# Patient Record
Sex: Male | Born: 1950 | Race: Black or African American | Hispanic: No | Marital: Married | Smoking: Never smoker
Health system: Southern US, Community
[De-identification: ages and names within clinical notes are randomized; demographics above are authoritative.]

---

## 2008-01-18 ENCOUNTER — Ambulatory Visit: Payer: Self-pay | Admitting: Otolaryngology

## 2008-03-08 ENCOUNTER — Ambulatory Visit: Payer: Self-pay | Admitting: Otolaryngology

## 2010-03-28 ENCOUNTER — Ambulatory Visit: Payer: Self-pay | Admitting: Unknown Physician Specialty

## 2017-11-01 ENCOUNTER — Other Ambulatory Visit: Payer: Self-pay

## 2017-11-01 ENCOUNTER — Encounter: Payer: Self-pay | Admitting: Gynecology

## 2017-11-01 ENCOUNTER — Ambulatory Visit
Admission: EM | Admit: 2017-11-01 | Discharge: 2017-11-01 | Disposition: A | Discharge status: Home / Self Care | Payer: Medicare Other | Attending: Family Medicine | Admitting: Family Medicine

## 2017-11-01 DIAGNOSIS — L539 Erythematous condition, unspecified: Secondary | ICD-10-CM

## 2017-11-01 DIAGNOSIS — S20162A Insect bite (nonvenomous) of breast, left breast, initial encounter: Secondary | ICD-10-CM

## 2017-11-01 MED ORDER — METHYLPREDNISOLONE SODIUM SUCC 40 MG IJ SOLR
80.0000 mg | Freq: Once | INTRAMUSCULAR | Status: AC
  Administered 2017-11-01: 80 mg via INTRAMUSCULAR

## 2017-11-01 MED ORDER — DOXYCYCLINE HYCLATE 100 MG PO CAPS: 100.0000 mg | ORAL_CAPSULE | Freq: Two times a day (BID) | ORAL | 0 refills | Status: DC

## 2017-11-01 NOTE — ED Provider Notes (Signed)
[facility]    CSN: [id number] Arrival date & time: 11/01/17  1102  History   Chief Complaint Chief Complaint  Patient presents with  . Insect Bite   HPI  67 year old male presents with possible insect bite of his left breast.  Patient states that he was outside weed eating yesterday.  He states that he suddenly felt an itchy area around his left breast.  He did not see an insect or feel a sting.  No sniffing pain.  He subsequently developed swelling and redness of the left breast.  He reports continued associated itching.  Also warmth. No reports of fever or chills.  No medications or interventions tried.  No known exacerbating or relieving factors.  No other associated symptoms.  No other complaints.  PMH: Obesity, unspecified    HTN (hypertension)    Impaired glucose tolerance     Surgical Hx: Appendectomy   Home Medications    Prior to Admission medications   Medication Sig Start Date End Date Taking? Authorizing Provider  Multiple Vitamin (MULTIVITAMIN) capsule Take by mouth.   Yes [provider]  sildenafil (REVATIO) 20 MG tablet May use 1-5 tabs daily 1/2 hr before intercourse 09/16/16  Yes [provider]  atorvastatin (LIPITOR) 20 MG tablet Take by mouth. 01/03/17 01/03/18  [provider]  doxycycline (VIBRAMYCIN) 100 MG capsule Take 1 capsule (100 mg total) by mouth 2 (two) times daily. 11/01/17   Tommie Samsook, Hadlei Stitt G, DO  fluticasone (FLONASE) 50 MCG/ACT nasal spray Place into the nose. 01/02/17 01/02/18  [provider]    Family History Throat cancer Brother    No Known Problems Father    No Known Problems Maternal Grandfather    No Known Problems Maternal Grandmother    High blood pressure (Hypertension) Mother    Kidney cancer Mother    No Known Problems Paternal Grandfather    No Known Problems Paternal Grandmother     Social History Social History   Tobacco Use  . Smoking status: Never Smoker    . Smokeless tobacco: Never Used  Substance Use Topics  . Alcohol use: Never    Frequency: Never  . Drug use: Never   Allergies   Patient has no allergy information on record.   Review of Systems Review of Systems  Constitutional: Negative.   Skin:       Left breast swelling, erythema. Warmth. Itching.    Physical Exam Triage Vital Signs ED Triage Vitals  Enc Vitals Group     BP 11/01/17 1119 111/70     Pulse Rate 11/01/17 1119 60     Resp 11/01/17 1119 18     Temp 11/01/17 1119 97.8 F (36.6 C)     Temp Source 11/01/17 1119 Oral     SpO2 11/01/17 1119 98 %     Weight 11/01/17 1119 (!) 305 lb (138.3 kg)     Height 11/01/17 1119 5\' 10"  (1.778 m)     Head Circumference --      Peak Flow --      Pain Score 11/01/17 1118 0     Pain Loc --      Pain Edu? --      Excl. in GC? --    Updated Vital Signs BP 111/70 (BP Location: Left Arm)   Pulse 60   Temp 97.8 F (36.6 C) (Oral)   Resp 18   Ht 5\' 10"  (1.778 m)   Wt (!) 305 lb (138.3 kg)   SpO2  98%   BMI 43.76 kg/m   Physical Exam  Constitutional: He is oriented to person, place, and time. He appears well-developed. No distress.  HENT:  Head: Normocephalic and atraumatic.  Pulmonary/Chest: Effort normal. No respiratory distress.  Neurological: He is alert and oriented to person, place, and time.  Skin:  Left breast -diffuse erythema which is quite extensive and spreads outward to the upper abdomen as well as laterally.  Mild warmth.  No appreciable mass or fluctuance.  No tenderness.  Psychiatric: He has a normal mood and affect. His behavior is normal.  Nursing note and vitals reviewed.  UC Treatments / Results  Labs (all labs ordered are listed, but only abnormal results are displayed) Labs Reviewed - No data to display  EKG None  Radiology No results found.  Procedures Procedures (including critical care time)  Medications Ordered in UC Medications  methylPREDNISolone sodium succinate (SOLU-MEDROL)  40 mg/mL injection 80 mg (80 mg Intramuscular Given 11/01/17 1141)    Initial Impression / Assessment and Plan / UC Course  I have reviewed the triage vital signs and the nursing notes.  Pertinent labs & imaging results that were available during my care of the patient were reviewed by me and considered in my medical decision making (see chart for details).    67 year old male presents with left breast erythema, warmth, and itching.  Bug bite or bee sting and allergic response versus cellulitis.  Placing on doxycycline to be conservative.  Patient was given a shot of Depo-Medrol and also instructed to take Benadryl.  Clinically I suspect that this is allergic in nature given the itching.  However, given the extensive erythema, I elected to put him on doxycycline to ensure no underlying cellulitis.  I advised him to follow-up here or go to the hospital if he fails to improve or worsens.  Final Clinical Impressions(s) / UC Diagnoses   Final diagnoses:  Breast erythema     Discharge Instructions     Antibiotic as prescribed.  Benadryl for itching and redness.  Call with concerns or if not improving.  Take care  Dr. Adriana Simas    ED Prescriptions    Medication Sig Dispense Auth. Provider   doxycycline (VIBRAMYCIN) 100 MG capsule Take 1 capsule (100 mg total) by mouth 2 (two) times daily. 14 capsule Tommie Sams, DO     Controlled Substance Prescriptions  Controlled Substance Registry consulted? Not Applicable   Tommie Sams, DO 11/01/17 1156

## 2017-11-01 NOTE — Discharge Instructions (Signed)
Antibiotic as prescribed.  Benadryl for itching and redness.  Call with concerns or if not improving.  Take care  Dr. Adriana Simasook

## 2017-11-01 NOTE — ED Triage Notes (Signed)
Per patient insect bite at his left breast x yesterday while mowing his yard. Per patient warn to the touch.

## 2018-09-29 ENCOUNTER — Emergency Department: Payer: Medicare Other

## 2018-09-29 ENCOUNTER — Encounter: Payer: Self-pay | Admitting: Emergency Medicine

## 2018-09-29 ENCOUNTER — Other Ambulatory Visit: Payer: Self-pay

## 2018-09-29 ENCOUNTER — Emergency Department
Admission: EM | Admit: 2018-09-29 | Discharge: 2018-09-29 | Disposition: A | Discharge status: Home / Self Care | Payer: Medicare Other | Attending: Emergency Medicine | Admitting: Emergency Medicine

## 2018-09-29 DIAGNOSIS — R2681 Unsteadiness on feet: Secondary | ICD-10-CM | POA: Insufficient documentation

## 2018-09-29 DIAGNOSIS — M4802 Spinal stenosis, cervical region: Secondary | ICD-10-CM | POA: Diagnosis not present

## 2018-09-29 DIAGNOSIS — R531 Weakness: Secondary | ICD-10-CM | POA: Insufficient documentation

## 2018-09-29 DIAGNOSIS — G9520 Unspecified cord compression: Secondary | ICD-10-CM | POA: Insufficient documentation

## 2018-09-29 DIAGNOSIS — R202 Paresthesia of skin: Secondary | ICD-10-CM | POA: Diagnosis present

## 2018-09-29 DIAGNOSIS — Z7982 Long term (current) use of aspirin: Secondary | ICD-10-CM | POA: Insufficient documentation

## 2018-09-29 DIAGNOSIS — M79604 Pain in right leg: Secondary | ICD-10-CM | POA: Diagnosis not present

## 2018-09-29 LAB — COMPREHENSIVE METABOLIC PANEL
ALT: 16 U/L (ref 0–44)
AST: 22 U/L (ref 15–41)
Albumin: 4.2 g/dL (ref 3.5–5.0)
Alkaline Phosphatase: 84 U/L (ref 38–126)
Anion gap: 7 (ref 5–15)
BUN: 13 mg/dL (ref 8–23)
CO2: 23 mmol/L (ref 22–32)
Calcium: 8.4 mg/dL — ABNORMAL LOW (ref 8.9–10.3)
Chloride: 109 mmol/L (ref 98–111)
Creatinine, Ser: 0.93 mg/dL (ref 0.61–1.24)
GFR calc Af Amer: >60 mL/min (ref >60)
GFR calc non Af Amer: >60 mL/min (ref >60)
Glucose, Bld: 127 mg/dL — ABNORMAL HIGH (ref 70–99)
Potassium: 3.5 mmol/L (ref 3.5–5.1)
Sodium: 139 mmol/L (ref 135–145)
Total Bilirubin: 0.7 mg/dL (ref 0.3–1.2)
Total Protein: 7 g/dL (ref 6.5–8.1)

## 2018-09-29 LAB — CBC
HCT: 39.8 % (ref 39.0–52.0)
Hemoglobin: 13.7 g/dL (ref 13.0–17.0)
MCH: 30.2 pg (ref 26.0–34.0)
MCHC: 34.4 g/dL (ref 30.0–36.0)
MCV: 87.9 fL (ref 80.0–100.0)
Platelets: 229 10*3/uL (ref 150–400)
RBC: 4.53 MIL/uL (ref 4.22–5.81)
RDW: 13.7 % (ref 11.5–15.5)
WBC: 7.6 10*3/uL (ref 4.0–10.5)
nRBC: 0 % (ref 0.0–0.2)

## 2018-09-29 NOTE — ED Notes (Signed)
Waiting for personnel from [facility] to apply aspen cervical collar.

## 2018-09-29 NOTE — ED Notes (Signed)
BIO- TECH  CALLED  FOR  BRACE  PER  DR  PADUCHOWSKI  MD

## 2018-09-29 NOTE — ED Provider Notes (Signed)
[facility] Emergency Department Provider Note  ____________________________________________  Time seen: Approximately 1:24 PM  I have reviewed the triage vital signs and the nursing notes.   HISTORY  Chief Complaint Numbness    HPI Lonnie Rodriguez is a 68 y.o. male presents to the emergency department with right lower extremity tingling, weakness and pain that started 3 weeks ago after patient had a severe episode of low back pain.  Patient also states that he has developed tingling of the bilateral hands over the past 3 weeks as well.  He states that he did not seek care for his complaints due to COVID-19.  Patient states that pain right lower extremity has been severe enough that he is dragging his leg.  He denies fever or bowel or bladder incontinence.  He denies a history of similar issues in the past.        History reviewed. No pertinent past medical history.  There are no active problems to display for this patient.   History reviewed. No pertinent surgical history.  Prior to Admission medications   Medication Sig Start Date End Date Taking? Authorizing Provider  aspirin EC 81 MG tablet Take 81 mg by mouth daily.   Yes [provider]  naproxen sodium (ALEVE) 220 MG tablet Take 220-440 mg by mouth 2 (two) times daily as needed (back pain and/or stiffness).   Yes [provider]    Allergies Patient has no known allergies.  No family history on file.  Social History Social History   Tobacco Use  . Smoking status: Never Smoker  . Smokeless tobacco: Never Used  Substance Use Topics  . Alcohol use: Never    Frequency: Never  . Drug use: Never     Review of Systems  Constitutional: No fever/chills Eyes: No visual changes. No discharge ENT: No upper respiratory complaints. Cardiovascular: no chest pain. Respiratory: no cough. No SOB. Gastrointestinal: No abdominal pain.  No nausea, no vomiting.  No diarrhea.  No  constipation. Musculoskeletal: Patient has right lower extremity pain and weakness.  Skin: Negative for rash, abrasions, lacerations, ecchymosis. Neurological: Patient has paresthesias of the bilateral hands and weakness in the right lower extremity.  ____________________________________________   PHYSICAL EXAM:  VITAL SIGNS: ED Triage Vitals  Enc Vitals Group     BP 09/29/18 1205 122/70     Pulse Rate 09/29/18 1101 84     Resp 09/29/18 1101 20     Temp 09/29/18 1101 98.5 F (36.9 C)     Temp Source 09/29/18 1101 Oral     SpO2 09/29/18 1101 98 %     Weight 09/29/18 1102 (!) 305 lb (138.3 kg)     Height 09/29/18 1102 5\' 10"  (1.778 m)     Head Circumference --      Peak Flow --      Pain Score 09/29/18 1102 0     Pain Loc --      Pain Edu? --      Excl. in GC? --      Constitutional: Alert and oriented. Well appearing and in no acute distress. Eyes: Conjunctivae are normal. PERRL. EOMI. Head: Atraumatic. ENT:      Ears: TMs are pearly.       Nose: No congestion/rhinnorhea.      Mouth/Throat: Mucous membranes are moist.  Neck: No stridor.  No cervical spine tenderness to palpation. Cardiovascular: Normal rate, regular rhythm. Normal S1 and S2.  Good peripheral circulation. Respiratory: Normal respiratory effort  without tachypnea or retractions. Lungs CTAB. Good air entry to the bases with no decreased or absent breath sounds. Gastrointestinal: Bowel sounds 4 quadrants. Soft and nontender to palpation. No guarding or rigidity. No palpable masses. No distention. No CVA tenderness. Musculoskeletal: Patient has 5 out of 5 strength in the upper and lower extremities bilaterally and symmetrically.  Full range of motion to all extremities. No gross deformities appreciated. Neurologic:  Normal speech and language.  Patient complains of paresthesias diffusely along right lower extremity in no specific dermatomal pattern. He has intact sensation to light and deep touch. Skin:  Skin is  warm, dry and intact. No rash noted. Psychiatric: Mood and affect are normal. Speech and behavior are normal. Patient exhibits appropriate insight and judgement.   ____________________________________________   LABS (all labs ordered are listed, but only abnormal results are displayed)  Labs Reviewed  COMPREHENSIVE METABOLIC PANEL - Abnormal; Notable for the following components:      Result Value   Glucose, Bld 127 (*)    Calcium 8.4 (*)    All other components within normal limits  CBC  CBG MONITORING, ED   ____________________________________________  EKG   ____________________________________________  RADIOLOGY I personally viewed and evaluated these images as part of my medical decision making, as well as reviewing the written report by the radiologist.  Dg Cervical Spine With Flex & Extend  Result Date: 09/29/2018 CLINICAL DATA:  Pt reports numbness and losing balance on the right side for 3 weeks. Pt reports swelling in both feet as well. Pt states his MD sent him here, no other pertinent medical history EXAM: CERVICAL SPINE COMPLETE WITH FLEXION AND EXTENSION VIEWS COMPARISON:  MR 09/29/2018 FINDINGS: Straightening of the normal cervical lordosis. No evidence of dynamic instability on flexion/extension. Negative for fracture. Mild narrowing of interspaces C3-4, C4-5, C6-7. Fusion across the C5-6 interspace. No prevertebral soft tissue swelling. Multiple missing teeth and dental restorations. IMPRESSION: 1. Negative for fracture or other acute bone abnormality. 2. Multilevel spondylitic changes as above. 3. Loss of the normal cervical spine lordosis, which may be secondary to positioning, spasm, or soft tissue injury. Electronically Signed   By: Corlis Leak M.D.   On: 09/29/2018 16:53   Ct Head Wo Contrast  Result Date: 09/29/2018 CLINICAL DATA:  Numbness and balance disturbance over the last 3 weeks. Swelling of the lower extremities. EXAM: CT HEAD WITHOUT CONTRAST TECHNIQUE:  Contiguous axial images were obtained from the base of the skull through the vertex without intravenous contrast. COMPARISON:  None. FINDINGS: Brain: The brain shows a normal appearance without evidence of malformation, atrophy, old or acute small or large vessel infarction, mass lesion, hemorrhage, hydrocephalus or extra-axial collection. Vascular: No hyperdense vessel. No evidence of atherosclerotic calcification. Skull: Normal.  No traumatic finding.  No focal bone lesion. Sinuses/Orbits: Sinuses are clear. Orbits appear normal. Mastoids are clear. Other: None significant IMPRESSION: Normal head CT Electronically Signed   By: Paulina Fusi M.D.   On: 09/29/2018 11:27   Mr Brain Wo Contrast  Result Date: 09/29/2018 CLINICAL DATA:  Numbness and balance disturbance. EXAM: MRI HEAD WITHOUT CONTRAST TECHNIQUE: Multiplanar, multiecho pulse sequences of the brain and surrounding structures were obtained without intravenous contrast. COMPARISON:  Head CT 09/29/2018 FINDINGS: BRAIN: There is no acute infarct, acute hemorrhage or extra-axial collection. The midline structures are normal. No midline shift or other mass effect. Multifocal white matter hyperintensity, most commonly due to chronic ischemic microangiopathy. The cerebral and cerebellar volume are age-appropriate. No hydrocephalus. Susceptibility-sensitive sequences  show no chronic microhemorrhage or superficial siderosis. No mass lesion. VASCULAR: The major intracranial arterial and venous sinus flow voids are normal. SKULL: No calvarial lesion. SINUSES/ORBITS: No fluid levels or advanced mucosal thickening. No mastoid or middle ear effusion. The orbits are normal. IMPRESSION: Chronic small vessel ischemia without acute intracranial abnormality. Electronically Signed   By: Deatra RobinsonKevin  Herman M.D.   On: 09/29/2018 15:18   Mr Cervical Spine Wo Contrast  Result Date: 09/29/2018 CLINICAL DATA:  Or right-sided numbness and balance loss. EXAM: MRI CERVICAL AND  LUMBAR SPINE WITHOUT CONTRAST TECHNIQUE: Multiplanar and multiecho pulse sequences of the cervical spine, to include the craniocervical junction and cervicothoracic junction, and lumbar spine, were obtained without intravenous contrast. COMPARISON:  None. FINDINGS: MRI CERVICAL SPINE FINDINGS Alignment: Physiologic. Vertebrae: No fracture, evidence of discitis, or bone lesion. There is fusion across the C5-6 disc space. Cord: There is hyperintense T2-weighted signal within the spinal cord at the C3-4 level. Posterior Fossa, vertebral arteries, paraspinal tissues: Negative. Disc levels: C2-3: Degenerative disc disease with small left subarticular protrusion. No spinal canal stenosis. Mild right neural foraminal stenosis. C3-4: Intermediate disc osteophyte complex with facet hypertrophy and ligamentum flavum redundancy causing severe spinal canal stenosis with impingement of the spinal cord. There is hyperintense T2-weighted signal within the cord at the C4 level. Moderate right and severe left neural foraminal stenosis. C4-5: There is a small disc osteophyte complex with mild spinal canal stenosis. Moderate right and mild left neural foraminal stenosis. C5-6: Disc space narrowing with small central protrusion. No spinal canal stenosis. No neural foraminal stenosis. C6-7: Intermediate sized disc osteophyte complex. No spinal canal stenosis. Mild bilateral foraminal stenosis. C7-T1: Intermediate sized disc osteophyte complex. No central spinal canal stenosis. Mild right and moderate left neural foraminal stenosis. MRI LUMBAR SPINE FINDINGS Segmentation:  Standard. Alignment:  Physiologic. Vertebrae: Endplate signal changes at L4-5 and L5-S1. No compression fracture or discitis-osteomyelitis. Conus medullaris and cauda equina: Conus extends to the L1-2 level. Conus and cauda equina appear normal. Paraspinal and other soft tissues: Negative. Disc levels: T12-L1: Small disc bulge without stenosis. L1-2: Small disc bulge  without spinal canal stenosis. Mild right foraminal narrowing. L2-3: Disc bulge with narrowing of the left lateral recess. No central spinal canal stenosis. Mild foraminal narrowing bilaterally. L3-4: Minimal disc bulge without spinal canal or neural foraminal stenosis. L4-5: Intermediate sized diffuse disc bulge with endplate spurring. Mild spinal canal stenosis and moderate right foraminal stenosis. L5-S1: Left eccentric disc bulge with endplate spurring. No central spinal canal stenosis. Mild right and moderate left neural foraminal stenosis. IMPRESSION: 1. 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 Moderate right and severe left neural foraminal stenosis also at this level. 2. Moderate right C5 and left C8 neural foraminal stenosis. 3. Mild lumbar spinal canal stenosis at L4-5 with moderate right foraminal stenosis. 4. Moderate left L5 S1 neural foraminal stenosis. Electronically Signed   By: Deatra RobinsonKevin  Herman M.D.   On: 09/29/2018 15:39   Mr Lumbar Spine Wo Contrast  Result Date: 09/29/2018 CLINICAL DATA:  Or right-sided numbness and balance loss. EXAM: MRI CERVICAL AND LUMBAR SPINE WITHOUT CONTRAST TECHNIQUE: Multiplanar and multiecho pulse sequences of the cervical spine, to include the craniocervical junction and cervicothoracic junction, and lumbar spine, were obtained without intravenous contrast. COMPARISON:  None. FINDINGS: MRI CERVICAL SPINE FINDINGS Alignment: Physiologic. Vertebrae: No fracture, evidence of discitis, or bone lesion. There is fusion across the C5-6 disc space. Cord: There is hyperintense  T2-weighted signal within the spinal cord at the C3-4 level. Posterior Fossa, vertebral arteries, paraspinal tissues: Negative. Disc levels: C2-3: Degenerative disc disease with small left subarticular protrusion. No spinal canal stenosis. Mild right neural foraminal stenosis. C3-4: Intermediate disc  osteophyte complex with facet hypertrophy and ligamentum flavum redundancy causing severe spinal canal stenosis with impingement of the spinal cord. There is hyperintense T2-weighted signal within the cord at the C4 level. Moderate right and severe left neural foraminal stenosis. C4-5: There is a small disc osteophyte complex with mild spinal canal stenosis. Moderate right and mild left neural foraminal stenosis. C5-6: Disc space narrowing with small central protrusion. No spinal canal stenosis. No neural foraminal stenosis. C6-7: Intermediate sized disc osteophyte complex. No spinal canal stenosis. Mild bilateral foraminal stenosis. C7-T1: Intermediate sized disc osteophyte complex. No central spinal canal stenosis. Mild right and moderate left neural foraminal stenosis. MRI LUMBAR SPINE FINDINGS Segmentation:  Standard. Alignment:  Physiologic. Vertebrae: Endplate signal changes at L4-5 and L5-S1. No compression fracture or discitis-osteomyelitis. Conus medullaris and cauda equina: Conus extends to the L1-2 level. Conus and cauda equina appear normal. Paraspinal and other soft tissues: Negative. Disc levels: T12-L1: Small disc bulge without stenosis. L1-2: Small disc bulge without spinal canal stenosis. Mild right foraminal narrowing. L2-3: Disc bulge with narrowing of the left lateral recess. No central spinal canal stenosis. Mild foraminal narrowing bilaterally. L3-4: Minimal disc bulge without spinal canal or neural foraminal stenosis. L4-5: Intermediate sized diffuse disc bulge with endplate spurring. Mild spinal canal stenosis and moderate right foraminal stenosis. L5-S1: Left eccentric disc bulge with endplate spurring. No central spinal canal stenosis. Mild right and moderate left neural foraminal stenosis. IMPRESSION: 1. 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 Moderate right and severe left neural foraminal  stenosis also at this level. 2. Moderate right C5 and left C8 neural foraminal stenosis. 3. Mild lumbar spinal canal stenosis at L4-5 with moderate right foraminal stenosis. 4. Moderate left L5 S1 neural foraminal stenosis. Electronically Signed   By: Deatra Robinson M.D.   On: 09/29/2018 15:39    ____________________________________________    PROCEDURES  Procedure(s) performed:    Procedures    Medications - No data to display   ____________________________________________   INITIAL IMPRESSION / ASSESSMENT AND PLAN / ED COURSE  Pertinent labs & imaging results that were available during my care of the patient were reviewed by me and considered in my medical decision making (see chart for details).  Review of the [place] CSRS was performed in accordance of the [facility] prior to dispensing any controlled drugs.        Assessment and Plan:  Cervical spine stenosis with spinal cord impingement. 68 year old male presents to the emergency department with bilateral hand tingling and right lower extremity weakness and paresthesias that has been occurring for the past 3 weeks.  Patient states that symptoms have been severe enough that he has had to use his cane.  On physical exam, patient has poor historian and reports diffuse paresthesias across right lower extremity in no specific dermatomal pattern.  Strength is symmetric in the upper and lower extremities.  Differential diagnosis included CVA, disc extrusion in the cervical or lumbar spine, brain lesion...  CBC and CMP were reassuring without leukocytosis.  MRI of the cervical spine was concerning for severe spinal cord stenosis with spinal cord impingement.  Neurosurgeon on-call Dr. Adriana Simas was consulted who personally evaluated patient in the emergency department.  Dr.  Adriana Simas conveyed that patient will need surgery this week.  He advised placing patient in a c-collar and having flexion/extension cervical spine films conducted in the ER.   Patient is to be discharged from the emergency department. Dr. Adriana Simas anticipates his office reaching out to patient to schedule an appointment to be seen as an outpatient next week.   All patient questions were answered.        ____________________________________________  FINAL CLINICAL IMPRESSION(S) / ED DIAGNOSES  Final diagnoses:  Cervical stenosis of spinal canal  Spinal cord compression (HCC)      NEW MEDICATIONS STARTED DURING THIS VISIT:  ED Discharge Orders    None          This chart was dictated using voice recognition software/Dragon. Despite best efforts to proofread, errors can occur which can change the meaning. Any change was purely unintentional.    Orvil Feil, PA-C 09/29/18 Leverne Humbles, MD 09/30/18 [phone]

## 2018-09-29 NOTE — Consult Note (Addendum)
Neurosurgery-New Consultation Evaluation 09/29/2018 Lonnie Rodriguez [id number]  Identifying Statement: Lonnie Rodriguez is a 68 y.o. male from [facility] [place] [address] with numbness and weakness  Physician Requesting Consultation: [facility] Emergency Department  History of Present Illness: Lonnie Rodriguez is seen in the ED for weakness and numbness that he feels started about 3 weeks ago. He does not remember an inciting event but has noticed more difficulty using his hands for small tasks and difficulty with balance. He describes numbness in both hands in all fingers going into arms, right worse than left. He also has some numbness in distal lower extremities. Given the worsening symptoms, he came to ED where a MRI was obtained.   He takes a baby ASAA but denies other medications or medical issues. He denies any previous swallowing difficulty. He does have some neck pain and some limited motion of neck.   Past Medical History:  History reviewed. No pertinent past medical history.  Social History: Social History   Socioeconomic History  . Marital status: Married    Spouse name: Not on file  . Number of children: Not on file  . Years of education: Not on file  . Highest education level: Not on file  Occupational History  . Not on file  Social Needs  . Financial resource strain: Not on file  . Food insecurity:    Worry: Not on file    Inability: Not on file  . Transportation needs:    Medical: Not on file    Non-medical: Not on file  Tobacco Use  . Smoking status: Never Smoker  . Smokeless tobacco: Never Used  Substance and Sexual Activity  . Alcohol use: Never    Frequency: Never  . Drug use: Never  . Sexual activity: Not on file  Lifestyle  . Physical activity:    Days per week: Not on file    Minutes per session: Not on file  . Stress: Not on file   Family History: No significant family history  Review of Systems:  Review of Systems - General ROS: Negative Psychological  ROS: Negative Ophthalmic ROS: Negative ENT ROS: Negative Hematological and Lymphatic ROS: Negative  Endocrine ROS: Negative Respiratory ROS: Negative Cardiovascular ROS: Negative Gastrointestinal ROS: Negative Genito-Urinary ROS: Negative Musculoskeletal ROS: Positive for neck pain Neurological ROS: Positive for numbness and weakness in all extremities Dermatological ROS: Negative  Physical Exam: BP 123/79 (BP Location: Right Arm)   Pulse (!) 53   Temp 98.5 F (36.9 C) (Oral)   Resp 16   Ht 5\' 10"  (1.778 m)   Wt (!) 138.3 kg   SpO2 100%   BMI 43.76 kg/m  Body mass index is 43.76 kg/m. Body surface area is 2.61 meters squared. General appearance: Alert, cooperative, in no acute distress Head: Normocephalic, atraumatic Eyes: Normal, EOM intact Oropharynx: Moist without lesions Neck: Supple, limited flexion and extension Ext: Mild edema in LE bilaterally, warm extremities  Neurologic exam:  Mental status: alertness: alert, affect: normal Speech: fluent and clear Motor:strength is 5/5 in bilateral deltoid, bicep, tricep. 4+/5 in grip and IO. He is 5/5 in bilateral hip flexion, knee flexion/extension, dorsi/plantar flexion Sensory: decrease to light touch in bilateral hands and also in right arm throughout. Decreased distally below calf in BLE Reflexes: 3+ at bilateral patella, bicep. Positive Hoffmans Gait: not tested  Imaging: MRI Cervical Spine: C3-4 severe spinal canal stenosis with spinal cord impingement and hyperintense T2-weighted signal consistent with compressive myelopathy. Moderate right and severe left neural foraminal  stenosis also at this level.  Moderate right C5 and left C8 neural foraminal stenosis.  Impression/Plan:  Lonnie Rodriguez is here with acute myelopathy that started 3 weeks ago. He has classic symptoms of this and hyperreflexia on exam. He does not remember a fall. MRI shows C3/4 stenosis and T2 cord signal changes with stenosis. We discussed that  surgery is recommended in these situations to prevent further injury but that recovery is variable for his current symptoms. We discussed that a fall or accident could also result in further injury. I have recommended a C3/4 ACDF for decompression and this will be followed by PT. He has been on ASA which we will stop. I will also place him in a cervical collar for protection when up walking. We have discussed possible surgery next week and he will follow up in clinic to discuss further with his spouse.    1.  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  Plan Will follow up in clinic to discuss C3/4 ACDF - Remain in collar when up moving

## 2018-09-29 NOTE — Discharge Instructions (Signed)
Anticipate a phone call from Dr. Patsey Berthold office. Wear c-collar when up and moving. Return to the emergency department with new or worsening symptoms.

## 2018-09-29 NOTE — ED Triage Notes (Signed)
Pt reports numbness and losing balance on the right side for 3 weeks. Pt reports swelling in both feet as well. Pt states his MD sent him here.

## 2019-01-13 ENCOUNTER — Other Ambulatory Visit: Payer: Self-pay

## 2019-01-13 ENCOUNTER — Ambulatory Visit: Payer: Medicare Other | Attending: Neurosurgery | Admitting: Physical Therapy

## 2019-01-13 DIAGNOSIS — M6281 Muscle weakness (generalized): Secondary | ICD-10-CM | POA: Diagnosis present

## 2019-01-13 DIAGNOSIS — M79602 Pain in left arm: Secondary | ICD-10-CM | POA: Diagnosis present

## 2019-01-13 DIAGNOSIS — M25611 Stiffness of right shoulder, not elsewhere classified: Secondary | ICD-10-CM | POA: Insufficient documentation

## 2019-01-13 DIAGNOSIS — M79601 Pain in right arm: Secondary | ICD-10-CM

## 2019-01-13 DIAGNOSIS — R2689 Other abnormalities of gait and mobility: Secondary | ICD-10-CM | POA: Diagnosis present

## 2019-01-14 ENCOUNTER — Encounter: Payer: Self-pay | Admitting: Physical Therapy

## 2019-01-14 NOTE — Therapy (Signed)
[facility] [address] [place], [place], [address] Phone: [phone]   Fax:  [phone]  Physical Therapy Evaluation  Patient Details  Name: Lonnie Rodriguez MRN: [id number] Date of Birth: 08/16/1950 Referring Provider (PT): Deetta Perla, MD   Encounter Date: 01/13/2019  PT End of Session - 01/15/19 0910    Visit Number  1    Number of Visits  16    Date for PT Re-Evaluation  03/10/19    Authorization - Visit Number  1    Authorization - Number of Visits  10    PT Start Time  0902    PT Stop Time  1009    PT Time Calculation (min)  67 min    Equipment Utilized During Treatment  Gait belt    Activity Tolerance  Patient tolerated treatment well    Behavior During Therapy  [facility] for tasks assessed/performed       History reviewed. No pertinent past medical history.  History reviewed. No pertinent surgical history.  There were no vitals filed for this visit.   Subjective Assessment - 01/14/19 0847    Subjective  Pt reports he has N/T in R UE and R LE. Pt states that over a year ago N/T started in fingers of B UEs. Pt reports that back in March-April of this year he woke up and that his "back was out." Pt was referred for emergency imaging because his physician thought he might have had a stroke. He was then referred for surgery for two bulging discs that had fused together and were pressing on a nerve in his neck. Pt reports he was referred for surgery immediately. Pt reports physician wanted him to have [place] at the time because if he falls he "could become paralyzed." Pt reports since surgery R LE has been "locked up." Pt reports no falls. Pt reports he drives himself. Pt reports goal is to stop ambulating with SPC. Pt reports 3/10 pain in UEs with numbness in fingers currently but that pain was 10/10 3 days ago. Pt reports reaching and walking aggravate pain. Pt reports rest, Tylenol and ice help with pain sx. Pt has follow-up  appointment with Dr. Lacinda Axon in 6 months.    Pertinent History  Pt reports he had home health post-surgery and that his HEP includes heel raises with UE support, hip abduction with UE support, marching with UE support, and squats with UE support.    Limitations  Walking;House hold activities    Patient Stated Goals  ambulate without cane, be able to lift R arm without pain    Currently in Pain?  Yes    Pain Score  3     Pain Location  Arm    Pain Orientation  Right    Pain Type  Chronic pain    Pain Onset  More than a month ago    Pain Frequency  Intermittent    Aggravating Factors   reaching, walking    Pain Relieving Factors  Tylenol, rest, ice with severe pain        [facility] PT Assessment - 01/15/19 0001      Assessment   Medical Diagnosis  S/P cervical spinal fusion    Referring Provider (PT)  Deetta Perla, MD    Onset Date/Surgical Date  10/15/18    Hand Dominance  Right    Next MD Visit  March 2021    Prior Therapy  Yes   home health s/p cervical  spinal fusion     Precautions   Precautions  Cervical      Balance Screen   Has the patient fallen in the past 6 months  No    Has the patient had a decrease in activity level because of a fear of falling?   Yes      Home Environment   Living Environment  Private residence       FOTO: 30 (32 age norm)  Palpation Pain noted at L cervical paraspinals and R posterior delt   Cervical Screen AROM Flexion - 40 deg. Extension - 20 deg. "stiffness and dizziness" L rotn. - 20 deg. R rotn - 28 deg- pt reports stiffness L lat. Flex. 20 deg R lat. Flex. 20 deg   AROM UE Shoulder flexion: R: 120 deg and sore;  L: WNL Shoulder abduction: R: 94 deg. pain limited; L: WNL Shoulder external rotation: R: 76 deg pain limited; L: WNL Shoulder internal rotation: WNL B  MMT UE: Shoulder flexion: L: 5/5; R pain limited unable to assess Shoulder abduction: L: 4+/5; R: unable to assess d/t pain  Shoulder external rotation: 5/5 B Shoulder  internal rotation: 5/5 B Elbow flexion: 5/5 B Elbow extension: 5/5 B  Grip Strength: R  70.4 lbs; L 83.8 lbs  LE MMT: Grossly 5/5 except Hip flexion: L: 5/5, R: 4-/5  POSTURE:  forward head posture, increased upper trap activation  Gait: Hyperextension on R knee, limited extension on L knee. Antalgic  Sensation UE: Altered C5 dermatome on R compared to L LE: R LE grossly impaired compared to L LE  Proprioception: unimpaired  SPECIAL TESTS UE: Painful Arc (Pain from 60 to 120 degrees scaption):  positive Roos: positive  Speed's test (biceps): positive LE: 5xSTS: 16 sec without UE support Gait speed: with SPC: 0.9 m/s; No AD: 0.81 m/s Heel raise test: R LE 3 reps, L LE 8 reps. Significant WB through B UE   Therapeutic exercise: See HEP    PT Education - 01/14/19 0848    Education Details  Pt educated on HEP (see HEP)    Person(s) Educated  Patient    Methods  Explanation;Demonstration    Comprehension  Verbalized understanding;Returned demonstration      Plan - 01/15/19 0957    Clinical Impression Statement  Pt is a pleasant 68 y/o male presenting to therapy with c/o pain-limited R UE ROM and antalgic gait. Pt FOTO 86 (age norm 86), indicating decreased funcitonal mobility. MMT of B UEs reveal pt grossly 5/5 B except for R shoulder flexion and abduction, which could not be assessed d/t pain, and decreased L shoulder abduction 4+/5. Grip strength: R 70.4#; L 83.8#. AROM B UEs: shoulder flexion: R: 120 deg and sore;  L: WNL; shoulder abduction: R: 94 deg. and pain limited; L: WNL; shoulder external rotation: R: 76 deg. and pain limited; L: WNL; shoulder internal rotation: WNL B. Cervical AROM grossly limited: flexion 40 deg., extension 20 deg., rotation L/R 20/28 deg., L/R lateral flexion 20 deg B. With palpation pain noted at L cervical paraspinals and R posterior delt. LE MMT is grossly 5/5 except hip flexion: L: 5/5, R: 4-/5. Pt with forward head posture with seated and  standing. Pt demonstrates antalgic gait with hyperextension of R knee and limited extension of L knee. Sensation testing of B UEs and B LEs reveal altered C5 dermatome on R UE compared to L UE, and R LE is grossly impaired compared to L LE. Pt special tests of  UE: positive painful arc; positive roos test, positive speeds tests.  Pt 5xSTS was 16 sec, indicating decreased B LE power. 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 Pt heel raise test:  R LE 3 reps, L LE 8 reps with significant WB through B UE. Pt will continue to benefit from further skilled therapy to improve pain-free cervical and R UE AROM and strength, and to improve B LE strength and gait mechanics.    Personal Factors and Comorbidities  Age    Examination-Activity Limitations  AnimatorLocomotion Level;Reach Overhead    Examination-Participation Restrictions  Community Activity;Yard Work    Conservation officer, historic buildingstability/Clinical Decision Making  Evolving/Moderate complexity    Clinical Decision Making  Moderate    Rehab Potential  Good    PT Frequency  2x / week    PT Duration  8 weeks    PT Treatment/Interventions  ADLs/Self Care Home Management;Biofeedback;Cryotherapy;Electrical Stimulation;Moist Heat;Traction;DME Instruction;Gait training;Stair training;Functional mobility training;Therapeutic activities;Therapeutic exercise;Balance training;Neuromuscular re-education;Patient/family education;Orthotic Fit/Training;Manual techniques;Passive range of motion;Energy conservation    PT Next Visit Plan  LE strengthening, gait training, UE AROM    PT Home Exercise Plan  HEP: shoulder abduction/flexion wtih pulleys, putty, squats, and marching with UE support    Consulted and Agree with Plan of Care  Patient       Patient will benefit from skilled therapeutic intervention in order to improve the following deficits and impairments:  Abnormal gait, Impaired sensation, Improper body mechanics,  Pain, Decreased coordination, Decreased mobility, Postural dysfunction, Decreased activity tolerance, Decreased endurance, Decreased range of motion, Decreased strength, Hypomobility, Impaired UE functional use, Difficulty walking, Impaired flexibility  Visit Diagnosis: Pain in both upper extremities  Decreased right shoulder range of motion  Antalgic gait  Other abnormalities of gait and mobility     Problem List There are no active problems to display for this patient.  Cammie McgeeMichael C Sherk, PT, DPT # [id number] Temple PaciniHaley Avontae Burkhead, SPT 01/15/2019, 1:00 PM  [facility] [address] [place], [place], [id number] Phone: [phone]   Fax:  [phone]  Name: Lonnie Rodriguez MRN: [id number] Date of Birth: 1950/05/22

## 2019-01-19 ENCOUNTER — Other Ambulatory Visit: Payer: Self-pay

## 2019-01-19 ENCOUNTER — Ambulatory Visit: Payer: Medicare Other | Admitting: Physical Therapy

## 2019-01-19 DIAGNOSIS — M79601 Pain in right arm: Secondary | ICD-10-CM | POA: Diagnosis not present

## 2019-01-19 DIAGNOSIS — M25611 Stiffness of right shoulder, not elsewhere classified: Secondary | ICD-10-CM

## 2019-01-19 DIAGNOSIS — M6281 Muscle weakness (generalized): Secondary | ICD-10-CM

## 2019-01-19 DIAGNOSIS — R2689 Other abnormalities of gait and mobility: Secondary | ICD-10-CM

## 2019-01-19 NOTE — Therapy (Addendum)
[facility] [address] [place], [place], [address] Phone: [phone]   Fax:  [phone]  Physical Therapy Treatment  Patient Details  Name: Lonnie Rodriguez MRN: [id number] Date of Birth: July 05, 1950 Referring Provider (PT): Lucy Chris, MD   Encounter Date: 01/19/2019  PT End of Session - 01/19/19 0834    Visit Number  2    Number of Visits  16    Date for PT Re-Evaluation  03/10/19    Authorization - Visit Number  2    Authorization - Number of Visits  10    PT Start Time  0807    PT Stop Time  0921    PT Time Calculation (min)  74 min    Equipment Utilized During Treatment  Gait belt    Activity Tolerance  Patient tolerated treatment well    Behavior During Therapy  [facility] for tasks assessed/performed       History reviewed. No pertinent past medical history.  History reviewed. No pertinent surgical history.  There were no vitals filed for this visit.  Therapeutic Exercise:  Nustep, level 5 x10 min Pulley abduction/flexion R UE - 2x20 each AAROM/AROM R shoulder flexion/abduction/ER - x multiple reps. Sit<>stands 2x10 Staggered sit<>stands 1x15 each. Cuing for technique Heel raises -2x20 Standing hip abduction - 2x20 cuing for technique/not using momentum Standing hip extension - 2x0  Neuro: Sharlene Motts 48/56 Obstacle course with cones and foam step. 2x8 Gait through clinic and outdoors in parking lot without AD,with  CGA from SPT - 5 min  Manual:  Pt supine PROM/stretching in R shoulder ER, abduction/flexion - 4x30 sec each Pt supine hamstring stretching, PF stretching B LEs- 4x30 sec each        PT Long Term Goals - 01/19/19 0933      PT LONG TERM GOAL #1   Title  Pt will score 55 on FOTO to indicate improved functional mobility    Baseline  FOTO score 45 01/13/2019    Time  8    Period  Weeks    Status  New    Target Date  03/10/19      PT LONG TERM GOAL #2   Title  Pt will increase cervical and R UE ROM  to [facility] with 0/10 pain in order to be able to reach overhead to complete ADLs.    Baseline  Cervical AROM grossly limited (Extension: 20deg, L rotn: 20 deg, R rotn: 28 deg, L/R lat flex: 20 deg), UE AROM WNL except R shoulder flexion (120 deg), abduction (94 deg), and external rotation (76 deg) pain limited 01/13/2019    Time  8    Period  Weeks    Status  New    Target Date  03/10/19      PT LONG TERM GOAL #3   Title  Pt will ambulate with LRD for 5 minutes with improved upright posture and a gait speed of at least 1.43m/s to improve community participation.    Baseline  Gait speed with no AD is 0.41m/s, with SPC 0.2m/s, with forward head posture and rounded shoulders 01/08/2019    Time  8    Period  Weeks    Status  New    Target Date  03/10/19      PT LONG TERM GOAL #4   Title  Pt will perform 5xSTS without UE support in <12 sec to demonstrate increase LE power.    Baseline  5xSTS 16sec without UE support  01/08/2019    Time  8    Period  Weeks    Status  New    Target Date  03/10/19      PT LONG TERM GOAL #5   Title  Pt will perform 25 heel raises on B LE to demonstrate increased plantarflexor endurance for safety with gait.    Baseline  Heel raise test R LE 3 reps, L LE 8 reps, with significant WB through B LE 01/08/2019    Time  8    Period  Weeks    Status  New    Target Date  03/10/19      Additional Long Term Goals   Additional Long Term Goals  Yes      PT LONG TERM GOAL #6   Title  Pt will increase Berg score to at least 53/56 to demonstrate decrease fall risk    Baseline  Pt Berg score 48/56 01/19/2019    Time  4    Period  Weeks    Status  New    Target Date  02/16/19            Plan - 01/19/19 0932    Clinical Impression Statement  Pt scored 48/56 on the Berg indicating pt is at a moderate risk for falls. Pt demonstrates difficulty with tandem stance and single leg balance, indicating decreased quiet balance. Pt also demonstrated difficulty during obstacle course  with clearing L foot over obstacles, and with increasing B step-length, but demonstrate moderate improvement with verbal cuing. With gait in clinic and outdoors pt also with decreased R stance-time and L lateral lean. Pt reported increased fatigue with staggered sit<>stands with greater difficulty with R LE more than L LE. Pt also with pain with the eccentric component of R shoulder abduction during pulley exercises. Pt will benefit from further skilled therapy to increase B LE strength, gait mechanics and balance.    Personal Factors and Comorbidities  Age    Examination-Activity Limitations  AnimatorLocomotion Level;Reach Overhead    Examination-Participation Restrictions  Community Activity;Yard Work    Conservation officer, historic buildingstability/Clinical Decision Making  Evolving/Moderate complexity    Clinical Decision Making  Moderate    Rehab Potential  Good    PT Frequency  2x / week    PT Duration  8 weeks    PT Treatment/Interventions  ADLs/Self Care Home Management;Biofeedback;Cryotherapy;Electrical Stimulation;Moist Heat;Traction;DME Instruction;Gait training;Stair training;Functional mobility training;Therapeutic activities;Therapeutic exercise;Balance training;Neuromuscular re-education;Patient/family education;Orthotic Fit/Training;Manual techniques;Passive range of motion;Energy conservation    PT Next Visit Plan  LE strengthening, gait training, UE AROM; UE isometrics    PT Home Exercise Plan  HEP: shoulder abduction/flexion wtih pulleys, putty, squats, and marching with UE support    Consulted and Agree with Plan of Care  Patient       Patient will benefit from skilled therapeutic intervention in order to improve the following deficits and impairments:  Abnormal gait, Impaired sensation, Improper body mechanics, Pain, Decreased coordination, Decreased mobility, Postural dysfunction, Decreased activity tolerance, Decreased endurance, Decreased range of motion, Decreased strength, Hypomobility, Impaired UE functional use,  Difficulty walking, Impaired flexibility, Decreased balance  Visit Diagnosis: Pain in both upper extremities  Decreased right shoulder range of motion  Antalgic gait  Other abnormalities of gait and mobility  Muscle weakness (generalized)     Problem List There are no active problems to display for this patient.  Cammie McgeeMichael C Sherk, PT, DPT # [id number] Temple PaciniHaley Quill Grinder, SPT 01/20/2019, 7:43 AM  [facility] [address] [place], [place], [address] Phone: [phone]   Fax:  [phone]  Name: KENTAVIOUS MICHELE MRN: [id number] Date of Birth: 06-Feb-1951

## 2019-01-20 ENCOUNTER — Encounter: Payer: Self-pay | Admitting: Physical Therapy

## 2019-01-21 ENCOUNTER — Encounter: Payer: Self-pay | Admitting: Physical Therapy

## 2019-01-21 ENCOUNTER — Other Ambulatory Visit: Payer: Self-pay

## 2019-01-21 ENCOUNTER — Ambulatory Visit: Payer: Medicare Other | Admitting: Physical Therapy

## 2019-01-21 DIAGNOSIS — M25611 Stiffness of right shoulder, not elsewhere classified: Secondary | ICD-10-CM

## 2019-01-21 DIAGNOSIS — M6281 Muscle weakness (generalized): Secondary | ICD-10-CM

## 2019-01-21 DIAGNOSIS — M79601 Pain in right arm: Secondary | ICD-10-CM

## 2019-01-21 DIAGNOSIS — R2689 Other abnormalities of gait and mobility: Secondary | ICD-10-CM

## 2019-01-21 DIAGNOSIS — M79602 Pain in left arm: Secondary | ICD-10-CM

## 2019-01-21 NOTE — Therapy (Signed)
[place] [facility] [address] [place], [place], [address] Phone: [phone]   Fax:  [phone]  Physical Therapy Treatment  Patient Details  Name: Lonnie Rodriguez MRN: [id number] Date of Birth: [birth year] Referring Provider (PT): Lucy Chris, MD   Encounter Date: 01/21/2019  PT End of Session - 01/21/19 1144    Visit Number  3    Number of Visits  16    Date for PT Re-Evaluation  03/10/19    Authorization - Visit Number  3    Authorization - Number of Visits  10    PT Start Time  0808    PT Stop Time  0914    PT Time Calculation (min)  66 min    Equipment Utilized During Treatment  Gait belt    Activity Tolerance  Patient tolerated treatment well    Behavior During Therapy  [facility] for tasks assessed/performed       History reviewed. No pertinent past medical history.  History reviewed. No pertinent surgical history.  There were no vitals filed for this visit.  Subjective Assessment - 01/21/19 1140    Subjective  Pt reports after last PT session he went to [employer] without use of SPC.  Pt reports performing HEP.  Pt states 0/10 pain today.    Pertinent History  Pt reports he had home health post-surgery and that his HEP includes heel raises with UE support, hip abduction with UE support, marching with UE support, and squats with UE support.    Limitations  Walking;House hold activities    Patient Stated Goals  ambulate without cane, be able to lift R arm without pain    Currently in Pain?  No/denies    Pain Onset  More than a month ago        Therapeutic Exercise: Nustep, level 5 x10 min Pulley abduction/flexion R UE - 2x20 each Seated dowel AROM flexion - 2x10 Seated R AROM abduction in pain free range - 2x10 Standing dowel AROM flexion - 2x10 Standing R AROM abduction in pain free range - 2x10 Sit<>stands - x10 Staggered sit<>stands - 1x10 B Heel raises - 2x20 Marching - 2x20   Neuro: Obstacle course with cones and foam  step forward and laterally - x6 each  Stair SLB onto step and cones without UE support, forward and across - x20 with B LEs; Pt with clonus-like movement on L LE.  Manual:  Pt supine hamstring stretching B LEs - 2x30 sec each     PT Education - 01/21/19 1143    Education Details  Pt educated on gait mechanics.    Person(s) Educated  Patient    Methods  Explanation;Verbal cues    Comprehension  Verbalized understanding;Returned demonstration          PT Long Term Goals - 01/19/19 0933      PT LONG TERM GOAL #1   Title  Pt will score 55 on FOTO to indicate improved functional mobility    Baseline  FOTO score 45 01/13/2019    Time  8    Period  Weeks    Status  New    Target Date  03/10/19      PT LONG TERM GOAL #2   Title  Pt will increase cervical and R UE ROM to [facility] with 0/10 pain in order to be able to reach overhead to complete ADLs.    Baseline  Cervical AROM grossly limited (Extension: 20deg, L rotn: 20 deg, R  rotn: 28 deg, L/R lat flex: 20 deg), UE AROM WNL except R shoulder flexion (120 deg), abduction (94 deg), and external rotation (76 deg) pain limited 01/13/2019    Time  8    Period  Weeks    Status  New    Target Date  03/10/19      PT LONG TERM GOAL #3   Title  Pt will ambulate with LRD for 5 minutes with improved upright posture and a gait speed of at least 1.52m/s to improve community participation.    Baseline  Gait speed with no AD is 0.58m/s, with SPC 0.71m/s, with forward head posture and rounded shoulders 01/08/2019    Time  8    Period  Weeks    Status  New    Target Date  03/10/19      PT LONG TERM GOAL #4   Title  Pt will perform 5xSTS without UE support in <12 sec to demonstrate increase LE power.    Baseline  5xSTS 16sec without UE support 01/08/2019    Time  8    Period  Weeks    Status  New    Target Date  03/10/19      PT LONG TERM GOAL #5   Title  Pt will perform 25 heel raises on B LE to demonstrate increased plantarflexor endurance for safety  with gait.    Baseline  Heel raise test R LE 3 reps, L LE 8 reps, with significant WB through B LE 01/08/2019    Time  8    Period  Weeks    Status  New    Target Date  03/10/19      Additional Long Term Goals   Additional Long Term Goals  Yes      PT LONG TERM GOAL #6   Title  Pt will increase Berg score to at least 53/56 to demonstrate decrease fall risk    Baseline  Pt Berg score 48/56 01/19/2019    Time  4    Period  Weeks    Status  New    Target Date  02/16/19            Plan - 01/21/19 1153    Clinical Impression Statement  Pt reports pain in R shoulder with AROM at ~100 degrees of abduction; performed abduction AROM in pain-free range below 100 degrees.  Pt reports no pain with flexion ROM exercises.  Pt demonstrated difficulty with balance exercises on R>L.  Pt lateral stepping through obstacle course was difficult due to limited cervical ROM, saying it was "difficult to see" the course.  Pt was able to perform SLB stair tapping with intermittent UE support and demonstrated a clonus-like movement on L LE with additional reps.    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  Evolving/Moderate complexity    Clinical Decision Making  Moderate    Rehab Potential  Good    PT Frequency  2x / week    PT Duration  8 weeks    PT Treatment/Interventions  ADLs/Self Care Home Management;Biofeedback;Cryotherapy;Electrical Stimulation;Moist Heat;Traction;DME Instruction;Gait training;Stair training;Functional mobility training;Therapeutic activities;Therapeutic exercise;Balance training;Neuromuscular re-education;Patient/family education;Orthotic Fit/Training;Manual techniques;Passive range of motion;Energy conservation    PT Next Visit Plan  LE strengthening, gait training, UE AROM; UE isometrics    PT Home Exercise  Plan  HEP: shoulder abduction/flexion wtih pulleys, putty,  squats, marching with UE support, standing hip abduction with UE support, hamstring stretching    Consulted and Agree with Plan of Care  Patient       Patient will benefit from skilled therapeutic intervention in order to improve the following deficits and impairments:  Abnormal gait, Impaired sensation, Improper body mechanics, Pain, Decreased coordination, Decreased mobility, Postural dysfunction, Decreased activity tolerance, Decreased endurance, Decreased range of motion, Decreased strength, Hypomobility, Impaired UE functional use, Difficulty walking, Impaired flexibility, Decreased balance  Visit Diagnosis: Pain in both upper extremities  Decreased right shoulder range of motion  Antalgic gait  Other abnormalities of gait and mobility  Muscle weakness (generalized)     Problem List There are no active problems to display for this patient.  Cammie McgeeMichael C Sherk, PT, DPT # [id number] Ricarda FrameLiana Ariel Wingrove, SPT 01/21/2019, 12:08 PM  [facility] [address] [place], [place], [id number] Phone: [phone]   Fax:  [phone]  Name: Bethann HumbleDanny L Vernet MRN: [id number] Date of Birth: 08/30/1950

## 2019-01-26 ENCOUNTER — Other Ambulatory Visit: Payer: Self-pay

## 2019-01-26 ENCOUNTER — Ambulatory Visit: Payer: Medicare Other | Admitting: Physical Therapy

## 2019-01-26 ENCOUNTER — Encounter: Payer: Self-pay | Admitting: Physical Therapy

## 2019-01-26 DIAGNOSIS — M79601 Pain in right arm: Secondary | ICD-10-CM | POA: Diagnosis not present

## 2019-01-26 DIAGNOSIS — R2689 Other abnormalities of gait and mobility: Secondary | ICD-10-CM

## 2019-01-26 DIAGNOSIS — M6281 Muscle weakness (generalized): Secondary | ICD-10-CM

## 2019-01-26 DIAGNOSIS — M25611 Stiffness of right shoulder, not elsewhere classified: Secondary | ICD-10-CM

## 2019-01-26 DIAGNOSIS — M79602 Pain in left arm: Secondary | ICD-10-CM

## 2019-01-26 NOTE — Therapy (Signed)
[facility] [address] [place], [place], [address] Phone: [phone]   Fax:  [phone]  Physical Therapy Treatment  Patient Details  Name: Lonnie Rodriguez MRN: [id number] Date of Birth: 1950/11/07 Referring Provider (PT): Lucy Chris, MD   Encounter Date: 01/26/2019  PT End of Session - 01/26/19 1056    Visit Number  4    Number of Visits  16    Date for PT Re-Evaluation  03/10/19    Authorization - Visit Number  4    Authorization - Number of Visits  10    PT Start Time  0811    PT Stop Time  0907    PT Time Calculation (min)  56 min    Equipment Utilized During Treatment  Gait belt    Activity Tolerance  Patient tolerated treatment well    Behavior During Therapy  [facility] for tasks assessed/performed       History reviewed. No pertinent past medical history.  History reviewed. No pertinent surgical history.  There were no vitals filed for this visit.  Subjective Assessment - 01/26/19 1051    Subjective  Pt reports 0/10 pain today. Pt reports doing HEP and feeling "stronger." Pt does not walk with cane at home.    Pertinent History  Pt reports he had home health post-surgery and that his HEP includes heel raises with UE support, hip abduction with UE support, marching with UE support, and squats with UE support.    Limitations  Walking;House hold activities    Patient Stated Goals  ambulate without cane, be able to lift R arm without pain    Currently in Pain?  No/denies    Pain Onset  More than a month ago        Therapeutic Exercise:  NuStep L5 x10 min Standing dowel AROM shoulder flexion - 2x20 Standing AROM R shoulder abduction - 2x20 Staggered sit<>stand - 2x12 B Sit<>stand on foam 1x8 Seated scapular retractions - 2x15  Neuro:   Obstacle course with toe-touch on cones and stepping onto airex pad - 4x SLB Intermittent UE support and touch down- 2x30 sec B LEs (7/10 on RPS scale) Modified SLB with toe  touch - 2x30 sec B LE (4/10 on RPS scale)    PT Education - 01/26/19 1054    Education Details  Pt educated on safe SLB mechanics    Person(s) Educated  Patient    Methods  Explanation;Demonstration;Verbal cues    Comprehension  Returned demonstration;Verbalized understanding          PT Long Term Goals - 01/19/19 0933      PT LONG TERM GOAL #1   Title  Pt will score 55 on FOTO to indicate improved functional mobility    Baseline  FOTO score 45 01/13/2019    Time  8    Period  Weeks    Status  New    Target Date  03/10/19      PT LONG TERM GOAL #2   Title  Pt will increase cervical and R UE ROM to [facility] with 0/10 pain in order to be able to reach overhead to complete ADLs.    Baseline  Cervical AROM grossly limited (Extension: 20deg, L rotn: 20 deg, R rotn: 28 deg, L/R lat flex: 20 deg), UE AROM WNL except R shoulder flexion (120 deg), abduction (94 deg), and external rotation (76 deg) pain limited 01/13/2019    Time  8    Period  Weeks    Status  New    Target Date  03/10/19      PT LONG TERM GOAL #3   Title  Pt will ambulate with LRD for 5 minutes with improved upright posture and a gait speed of at least 1.29m/s to improve community participation.    Baseline  Gait speed with no AD is 0.16m/s, with SPC 0.22m/s, with forward head posture and rounded shoulders 01/08/2019    Time  8    Period  Weeks    Status  New    Target Date  03/10/19      PT LONG TERM GOAL #4   Title  Pt will perform 5xSTS without UE support in <12 sec to demonstrate increase LE power.    Baseline  5xSTS 16sec without UE support 01/08/2019    Time  8    Period  Weeks    Status  New    Target Date  03/10/19      PT LONG TERM GOAL #5   Title  Pt will perform 25 heel raises on B LE to demonstrate increased plantarflexor endurance for safety with gait.    Baseline  Heel raise test R LE 3 reps, L LE 8 reps, with significant WB through B LE 01/08/2019    Time  8    Period  Weeks    Status  New    Target Date   03/10/19      Additional Long Term Goals   Additional Long Term Goals  Yes      PT LONG TERM GOAL #6   Title  Pt will increase Berg score to at least 53/56 to demonstrate decrease fall risk    Baseline  Pt Berg score 48/56 01/19/2019    Time  4    Period  Weeks    Status  New    Target Date  02/16/19            Plan - 01/26/19 1058    Clinical Impression Statement  Pt demonstrates AROM WNL with pain at end range abduction.  Pt demonstrates and reports strength and balance on L>R.  Pt reports stiffness after sitting for long periods and uses mini squats to warm up knees for obstacle course.  Pt requires CGA with obstacle course and intermittent UE support with SLB exercises.  Pt able to maintain SLB endurance of 5 seconds B without UE support (7/10 RPS scale); modified SLB with toe touch endurance of 1 minute bilaterally without UE support (4/10 RPS scale).  Pt still demonstrates decreased stance time on R during gait without AD.    Personal Factors and Comorbidities  Age    Examination-Activity Limitations  Animator Activity;Yard Work    Conservation officer, historic buildings  Evolving/Moderate complexity    Clinical Decision Making  Moderate    Rehab Potential  Good    PT Frequency  2x / week    PT Duration  8 weeks    PT Treatment/Interventions  ADLs/Self Care Home Management;Biofeedback;Cryotherapy;Electrical Stimulation;Moist Heat;Traction;DME Instruction;Gait training;Stair training;Functional mobility training;Therapeutic activities;Therapeutic exercise;Balance training;Neuromuscular re-education;Patient/family education;Orthotic Fit/Training;Manual techniques;Passive range of motion;Energy conservation    PT Next Visit Plan  LE strengthening, gait training, UE AROM; Balance, light shoulder strengthening, AROM C-spine    PT Home Exercise Plan  HEP: shoulder abduction/flexion wtih pulleys, putty, squats,  marching with UE support, standing hip abduction with UE support, hamstring stretching    Consulted and Agree with Plan  of Care  Patient       Patient will benefit from skilled therapeutic intervention in order to improve the following deficits and impairments:  Abnormal gait, Impaired sensation, Improper body mechanics, Pain, Decreased coordination, Decreased mobility, Postural dysfunction, Decreased activity tolerance, Decreased endurance, Decreased range of motion, Decreased strength, Hypomobility, Impaired UE functional use, Difficulty walking, Impaired flexibility, Decreased balance  Visit Diagnosis: Antalgic gait  Decreased right shoulder range of motion  Pain in both upper extremities  Other abnormalities of gait and mobility  Muscle weakness (generalized)     Problem List There are no active problems to display for this patient.  Pura Spice, PT, DPT # [id number] Chinita Greenland, SPT 01/26/2019, 11:09 AM  [place] [facility] [address] [place], [place], [address] Phone: [phone]   Fax:  [phone]  Name: Lonnie Rodriguez MRN: [id number] Date of Birth: [birth year]

## 2019-01-26 NOTE — Patient Instructions (Signed)
Access Code: [email or web address]  URL: [email or web address]  Date: 01/26/2019  Prepared by: Dorcas Carrow   Exercises  Seated Scapular Retraction - 20 reps - 2 sets - 3 hold - 1x daily - 7x weekly

## 2019-01-28 ENCOUNTER — Ambulatory Visit: Payer: Medicare Other | Admitting: Physical Therapy

## 2019-01-28 ENCOUNTER — Encounter: Payer: Self-pay | Admitting: Physical Therapy

## 2019-01-28 ENCOUNTER — Other Ambulatory Visit: Payer: Self-pay

## 2019-01-28 DIAGNOSIS — M79601 Pain in right arm: Secondary | ICD-10-CM | POA: Diagnosis not present

## 2019-01-28 DIAGNOSIS — R2689 Other abnormalities of gait and mobility: Secondary | ICD-10-CM

## 2019-01-28 DIAGNOSIS — M25611 Stiffness of right shoulder, not elsewhere classified: Secondary | ICD-10-CM

## 2019-01-28 DIAGNOSIS — M6281 Muscle weakness (generalized): Secondary | ICD-10-CM

## 2019-01-28 NOTE — Therapy (Signed)
[place] [facility] [address] [place], [place], [address] Phone: [phone]   Fax:  [phone]  Physical Therapy Treatment  Patient Details  Name: Lonnie Rodriguez MRN: [id number] Date of Birth: 16-Mar-1951 Referring Provider (PT): Deetta Perla, MD   Encounter Date: 01/28/2019  PT End of Session - 01/28/19 1256    Visit Number  5    Number of Visits  16    Date for PT Re-Evaluation  03/10/19    Authorization - Visit Number  5    Authorization - Number of Visits  10    PT Start Time  0813    PT Stop Time  0908    PT Time Calculation (min)  55 min    Equipment Utilized During Treatment  Gait belt    Activity Tolerance  Patient tolerated treatment well    Behavior During Therapy  [facility] for tasks assessed/performed       History reviewed. No pertinent past medical history.  History reviewed. No pertinent surgical history.  There were no vitals filed for this visit.  Subjective Assessment - 01/28/19 1020    Subjective  Pt reports feeling weak in the R knee this morning and like his knee is going to buckle. Pt states he likes to walk with R hand in pocket because "it makes me feel more stable." Pt rates R knee pain as 2/10 currently.    Pertinent History  Pt reports he had home health post-surgery and that his HEP includes heel raises with UE support, hip abduction with UE support, marching with UE support, and squats with UE support.    Limitations  Walking;House hold activities    Patient Stated Goals  ambulate without cane, be able to lift R arm without pain    Currently in Pain?  Yes    Pain Score  2     Pain Location  Knee    Pain Orientation  Right    Pain Onset  Today    Multiple Pain Sites  No       Therapeutic Exercises: Nustep, L4 x 10 min BTB rows - standing rows 2x20 BTB ER - 2x20 upper trap activation/cuing for technique Sit<>stand on foam- 1x10, 1x6 . Cuing for technique, eccentric control, weight shift Gait training  - 2x. Cuing for technique.  Heel raises - 2x20 Forward/backward marching in // bars with 4# ankle weights -  Cuing for posture/technique. Decreased eccentric control with B hip flexors. Cuing to increase hip extension Side stepping with 4# in // bars. Increase R hip abduction ROM compared to R, drags stance foot B instead of clearing floor.  Seated hamstring stretch  Outdoor walking on uneven grassy surface and curb step downs - x2    PT Education - 01/28/19 1251    Education Details  Pt educated on UE band exercises and sit<>stand on foam technique.  Participation with Silver Sneakers once safe.    Person(s) Educated  Patient    Methods  Explanation;Demonstration    Comprehension  Verbalized understanding;Returned demonstration          PT Long Term Goals - 01/19/19 0933      PT LONG TERM GOAL #1   Title  Pt will score 55 on FOTO to indicate improved functional mobility    Baseline  FOTO score 45 01/13/2019    Time  8    Period  Weeks    Status  New    Target Date  03/10/19  PT LONG TERM GOAL #2   Title  Pt will [facility] cervical and [facility] with [facility]10 pain in order to be able to reach overhead to complete ADLs.    Baseline  Cervical AROM grossly limited (Extension: 20deg, L rotn: 20 deg, R rotn: 28 deg, L/R lat flex: 20 deg), UE AROM WNL except R shoulder flexion (120 deg), abduction (94 deg), and external rotation (76 deg) pain limited 01/13/2019    Time  8    Period  Weeks    Status  New    Target Date  03/10/19      PT LONG TERM GOAL #3   Title  Pt will ambulate with LRD for 5 minutes with improved upright posture and a gait speed of at least 1.6762m/s to improve community participation.    Baseline  Gait speed with no AD is 0.17106m/s, with SPC 0.1567m/s, with forward head posture and rounded shoulders 01/08/2019    Time  8    Period  Weeks    Status  New    Target Date  03/10/19      PT LONG TERM GOAL #4   Title  Pt will perform 5xSTS without UE support in <12 sec to  demonstrate increase LE power.    Baseline  5xSTS 16sec without UE support 01/08/2019    Time  8    Period  Weeks    Status  New    Target Date  03/10/19      PT LONG TERM GOAL #5   Title  Pt will perform 25 heel raises on B LE to demonstrate increased plantarflexor endurance for safety with gait.    Baseline  Heel raise test R LE 3 reps, L LE 8 reps, with significant WB through B LE 01/08/2019    Time  8    Period  Weeks    Status  New    Target Date  03/10/19      Additional Long Term Goals   Additional Long Term Goals  Yes      PT LONG TERM GOAL #6   Title  Pt will increase Berg score to at least 53/56 to demonstrate decrease fall risk    Baseline  Pt Berg score 48/56 01/19/2019    Time  4    Period  Weeks    Status  New    Target Date  02/16/19            Plan - 01/28/19 1301    Clinical Impression Statement  Pt requires cuing for theraband exercises to reduce R upper trap activation.  Pt able to improve posture and increased hip extension with cueing during backward marching.  Pt demonstrates decreased eccentric hip flexor control during standing marching and sit<>stands on foam pad indicating decreased B hip flexor strength.  Pt will benefit from further skilled therapy to increase B LE strength, improve balance and gait mechanics.    Personal Factors and Comorbidities  Age    Examination-Activity Limitations  AnimatorLocomotion Level;Reach Overhead    Examination-Participation Restrictions  Community Activity;Yard Work    Conservation officer, historic buildingstability/Clinical Decision Making  Evolving/Moderate complexity    Clinical Decision Making  Moderate    Rehab Potential  Good    PT Frequency  2x / week    PT Duration  8 weeks    PT Treatment/Interventions  ADLs/Self Care Home Management;Biofeedback;Cryotherapy;Electrical Stimulation;Moist Heat;Traction;DME Instruction;Gait training;Stair training;Functional mobility training;Therapeutic activities;Therapeutic exercise;Balance training;Neuromuscular  re-education;Patient/family education;Orthotic Fit/Training;Manual techniques;Passive range of motion;Energy conservation  PT Next Visit Plan  LE strengthening, gait training, UE AROM; Balance, light shoulder strengthening, AROM C-spine; issue new HEP with UE strengthening progression    PT Home Exercise Plan  HEP: shoulder abduction/flexion wtih pulleys, putty, squats, marching with UE support, standing hip abduction with UE support, hamstring stretching    Consulted and Agree with Plan of Care  Patient       Patient will benefit from skilled therapeutic intervention in order to improve the following deficits and impairments:  Abnormal gait, Impaired sensation, Improper body mechanics, Pain, Decreased coordination, Decreased mobility, Postural dysfunction, Decreased activity tolerance, Decreased endurance, Decreased range of motion, Decreased strength, Hypomobility, Impaired UE functional use, Difficulty walking, Impaired flexibility, Decreased balance  Visit Diagnosis: Antalgic gait  Decreased right shoulder range of motion  Pain in both upper extremities  Other abnormalities of gait and mobility  Muscle weakness (generalized)     Problem List There are no active problems to display for this patient.  Cammie Mcgee, PT, DPT # [id number] Ricarda Frame, SPT 01/28/2019, 5:54 PM  [place] [facility] [address] [place], [place], [address] Phone: [phone]   Fax:  [phone]  Name: Lonnie Rodriguez MRN: [id number] Date of Birth: 1950-09-14

## 2019-01-28 NOTE — Patient Instructions (Signed)
Access Code: [email or web address]  URL: [email or web address]  Date: 01/28/2019  Prepared by: Dorcas Carrow   Exercises  Seated Single Arm Shoulder Row with Anchored Resistance - 20 reps - 2 sets - 1x daily - 4x weekly  Shoulder External Rotation and Scapular Retraction with Resistance - 20 reps - 2 sets - 1x daily - 4x weekly  Seated Hamstring Stretch - 1 reps - 2 sets - 30 hold - 1x daily - 7x weekly  Standing Gastroc Stretch on Step with Counter Support - 1 reps - 2 sets - 30 hold - 1x daily - 7x weekly

## 2019-02-02 ENCOUNTER — Other Ambulatory Visit: Payer: Self-pay

## 2019-02-02 ENCOUNTER — Encounter: Payer: Self-pay | Admitting: Physical Therapy

## 2019-02-02 ENCOUNTER — Ambulatory Visit: Payer: Medicare Other | Admitting: Physical Therapy

## 2019-02-02 DIAGNOSIS — R2689 Other abnormalities of gait and mobility: Secondary | ICD-10-CM

## 2019-02-02 DIAGNOSIS — M79601 Pain in right arm: Secondary | ICD-10-CM

## 2019-02-02 DIAGNOSIS — M6281 Muscle weakness (generalized): Secondary | ICD-10-CM

## 2019-02-02 DIAGNOSIS — M25611 Stiffness of right shoulder, not elsewhere classified: Secondary | ICD-10-CM

## 2019-02-02 NOTE — Patient Instructions (Signed)
Access Code: [facility]  URL: [email or web address]  Date: 02/02/2019  Prepared by: Dorcas Carrow   Exercises  Standing Single Arm Shoulder Abduction with Resistance - 20 reps - 2 sets - 1x daily - 4x weekly  Standing Shoulder Flexion with Resistance - 20 reps - 2 sets - 1x daily - 4x weekly

## 2019-02-02 NOTE — Therapy (Signed)
[facility] [address] [place], [place], [address] Phone: [phone]   Fax:  [phone]  Physical Therapy Treatment  Patient Details  Name: Lonnie Rodriguez MRN: [id number] Date of Birth: Jun 07, 1950 Referring Provider (PT): Lucy Chris, MD   Encounter Date: 02/02/2019  PT End of Session - 02/02/19 1148    Visit Number  6    Number of Visits  16    Date for PT Re-Evaluation  03/10/19    Authorization - Visit Number  6    Authorization - Number of Visits  10    PT Start Time  0813    PT Stop Time  0911    PT Time Calculation (min)  58 min    Equipment Utilized During Treatment  Gait belt    Activity Tolerance  Patient tolerated treatment well    Behavior During Therapy  [facility] for tasks assessed/performed       History reviewed. No pertinent past medical history.  History reviewed. No pertinent surgical history.  There were no vitals filed for this visit.  Subjective Assessment - 02/02/19 0821    Subjective  PT reports 0/10 pain today.  Pt reports doing HEP.    Pertinent History  Pt reports he had home health post-surgery and that his HEP includes heel raises with UE support, hip abduction with UE support, marching with UE support, and squats with UE support.    Limitations  Walking;House hold activities    Patient Stated Goals  ambulate without cane, be able to lift R arm without pain    Currently in Pain?  No/denies       Therapeutic Exercise: Nustep, L4 x 10 min AROM: abduction (pain in R shoulder at end range), flexion, ER - all WFL Standing BTB rows - 2x15 Standing BTB ER - 2x15 Seated BTB flexion - 2x15 Standing GTB abduction - 2x15 (modified on R for proper form in a range <80 deg abd) Sit<>stand - 1x8 Sit<>stand on airex - 2x8 SLB with stair tapping - 1x10 B SLB on foam with stair tapping - 2x10 B  HEP updated: Access Code: [facility]    PT Long Term Goals - 01/19/19 0933      PT LONG TERM GOAL #1   Title   Pt will score 55 on FOTO to indicate improved functional mobility    Baseline  FOTO score 45 01/13/2019    Time  8    Period  Weeks    Status  New    Target Date  03/10/19      PT LONG TERM GOAL #2   Title  Pt will increase cervical and R UE ROM to [facility] with 0/10 pain in order to be able to reach overhead to complete ADLs.    Baseline  Cervical AROM grossly limited (Extension: 20deg, L rotn: 20 deg, R rotn: 28 deg, L/R lat flex: 20 deg), UE AROM WNL except R shoulder flexion (120 deg), abduction (94 deg), and external rotation (76 deg) pain limited 01/13/2019    Time  8    Period  Weeks    Status  New    Target Date  03/10/19      PT LONG TERM GOAL #3   Title  Pt will ambulate with LRD for 5 minutes with improved upright posture and a gait speed of at least 1.63m/s to improve community participation.    Baseline  Gait speed with no AD is 0.85m/s, with SPC 0.39m/s, with  forward head posture and rounded shoulders 01/08/2019    Time  8    Period  Weeks    Status  New    Target Date  03/10/19      PT LONG TERM GOAL #4   Title  Pt will perform 5xSTS without UE support in <12 sec to demonstrate increase LE power.    Baseline  5xSTS 16sec without UE support 01/08/2019    Time  8    Period  Weeks    Status  New    Target Date  03/10/19      PT LONG TERM GOAL #5   Title  Pt will perform 25 heel raises on B LE to demonstrate increased plantarflexor endurance for safety with gait.    Baseline  Heel raise test R LE 3 reps, L LE 8 reps, with significant WB through B LE 01/08/2019    Time  8    Period  Weeks    Status  New    Target Date  03/10/19      Additional Long Term Goals   Additional Long Term Goals  Yes      PT LONG TERM GOAL #6   Title  Pt will increase Berg score to at least 53/56 to demonstrate decrease fall risk    Baseline  Pt Berg score 48/56 01/19/2019    Time  4    Period  Weeks    Status  New    Target Date  02/16/19         Plan - 02/02/19 1144    Clinical Impression  Statement  Pt shoulder AROM WFL B with R shoulder abduction pain at end range.  Pt educated on technique with GTB abduction exercises in controlled, pain-free range below 80 deg abd.  Pt demonstrates difficulty/ weakness with hip flexion during SLB with stair tapping.  Pt has repeated clonus on L ankle with stair tapping exercise.  Pt will benefit from further skilled therapy to increase LE strength, increase UE strength and pain-free range, and improve balance.    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  Evolving/Moderate complexity    Clinical Decision Making  Moderate    Rehab Potential  Good    PT Frequency  2x / week    PT Duration  8 weeks    PT Treatment/Interventions  ADLs/Self Care Home Management;Biofeedback;Cryotherapy;Electrical Stimulation;Moist Heat;Traction;DME Instruction;Gait training;Stair training;Functional mobility training;Therapeutic activities;Therapeutic exercise;Balance training;Neuromuscular re-education;Patient/family education;Orthotic Fit/Training;Manual techniques;Passive range of motion;Energy conservation    PT Next Visit Plan  LE strengthening (hip flexor focus), progress UE strengthening, AROM C-spine    PT Home Exercise Plan  Access Code: [email or web address]    Consulted and Agree with Plan of Care  Patient       Patient will benefit from skilled therapeutic intervention in order to improve the following deficits and impairments:  Abnormal gait, Impaired sensation, Improper body mechanics, Pain, Decreased coordination, Decreased mobility, Postural dysfunction, Decreased activity tolerance, Decreased endurance, Decreased range of motion, Decreased strength, Hypomobility, Impaired UE functional use, Difficulty walking, Impaired flexibility, Decreased balance  Visit Diagnosis: Antalgic  gait  Decreased right shoulder range of motion  Muscle weakness (generalized)  Pain in both upper extremities  Other abnormalities of gait and mobility     Problem List There are no active problems to display for this patient.  Pura Spice, PT,  DPT # [id number] Ricarda FrameLiana Bryceton Hantz, SPT 02/02/2019, 11:51 AM  [facility] [address] [place], [place], [address] Phone: [phone]   Fax:  [phone]  Name: Bethann HumbleDanny L Blizzard MRN: [id number] Date of Birth: Feb 16, 1951

## 2019-02-04 ENCOUNTER — Encounter: Payer: Self-pay | Admitting: Physical Therapy

## 2019-02-04 ENCOUNTER — Ambulatory Visit: Payer: Medicare Other | Attending: Neurosurgery | Admitting: Physical Therapy

## 2019-02-04 ENCOUNTER — Other Ambulatory Visit: Payer: Self-pay

## 2019-02-04 DIAGNOSIS — M79602 Pain in left arm: Secondary | ICD-10-CM

## 2019-02-04 DIAGNOSIS — M25611 Stiffness of right shoulder, not elsewhere classified: Secondary | ICD-10-CM | POA: Diagnosis present

## 2019-02-04 DIAGNOSIS — M79601 Pain in right arm: Secondary | ICD-10-CM | POA: Diagnosis present

## 2019-02-04 DIAGNOSIS — M6281 Muscle weakness (generalized): Secondary | ICD-10-CM | POA: Diagnosis present

## 2019-02-04 DIAGNOSIS — R2689 Other abnormalities of gait and mobility: Secondary | ICD-10-CM

## 2019-02-04 NOTE — Therapy (Signed)
[facility] [address]. [place], [place], [address] Phone: [phone]   Fax:  [phone]  Physical Therapy Treatment  Patient Details  Name: Lonnie Rodriguez MRN: [id number] Date of Birth: 1951-02-16 Referring Provider (PT): Lucy Chris, MD   Encounter Date: 02/04/2019  PT End of Session - 02/05/19 1041    Visit Number  7    Number of Visits  16    Date for PT Re-Evaluation  03/10/19    Authorization - Visit Number  7    Authorization - Number of Visits  10    PT Start Time  0814    PT Stop Time  0901    PT Time Calculation (min)  47 min    Equipment Utilized During Treatment  Gait belt    Activity Tolerance  Patient tolerated treatment well;No increased pain    Behavior During Therapy  [facility] for tasks assessed/performed       History reviewed. No pertinent past medical history.  History reviewed. No pertinent surgical history.  There were no vitals filed for this visit.  Subjective Assessment - 02/04/19 0823    Subjective  Pt reports soreness in R shoulder and R leg due to delayed onset of muscle soreness.  Pt reports current pain in R shoulder as 3/10; no pain currently in R LE.  Pt reports taking a break from HEP yesterday due to soreness.    Pertinent History  Pt reports he had home health post-surgery and that his HEP includes heel raises with UE support, hip abduction with UE support, marching with UE support, and squats with UE support.    Limitations  Walking;House hold activities    Patient Stated Goals  ambulate without cane, be able to lift R arm without pain    Currently in Pain?  Yes    Pain Score  3     Pain Location  Shoulder    Pain Orientation  Right    Pain Descriptors / Indicators  Sore    Pain Type  Acute pain    Pain Onset  In the past 7 days    Multiple Pain Sites  Yes    Pain Score  0    Pain Location  Knee    Pain Orientation  Right    Pain Descriptors / Indicators  Sore    Pain Onset  In the  past 7 days         PT Education - 02/05/19 1040    Education Details  Pt educated on exercise moderation and lower resistance UE band exercises    Person(s) Educated  Patient    Methods  Explanation;Demonstration    Comprehension  Verbalized understanding;Returned demonstration        Therapeutic Exercise:  NuStep L4 x10 min Seated RTB rows - 2x20 Seated RTB ER - 2x20 Seated RTB abduction - 2x20 B Seated RTB flexion - 2x20 B  Manual Therapy: Supine R shoulder PROM/ stretching abduction, flexion, ER with STM to deltoids and upper trap - x10 minutes     PT Long Term Goals - 01/19/19 0933      PT LONG TERM GOAL #1   Title  Pt will score 55 on FOTO to indicate improved functional mobility    Baseline  FOTO score 45 01/13/2019    Time  8    Period  Weeks    Status  New    Target Date  03/10/19      PT  LONG TERM GOAL #2   Title  Pt will increase cervical and R UE ROM to [facility] with 0/10 pain in order to be able to reach overhead to complete ADLs.    Baseline  Cervical AROM grossly limited (Extension: 20deg, L rotn: 20 deg, R rotn: 28 deg, L/R lat flex: 20 deg), UE AROM WNL except R shoulder flexion (120 deg), abduction (94 deg), and external rotation (76 deg) pain limited 01/13/2019    Time  8    Period  Weeks    Status  New    Target Date  03/10/19      PT LONG TERM GOAL #3   Title  Pt will ambulate with LRD for 5 minutes with improved upright posture and a gait speed of at least 1.80419m/s to improve community participation.    Baseline  Gait speed with no AD is 0.485m/s, with SPC 0.5819m/s, with forward head posture and rounded shoulders 01/08/2019    Time  8    Period  Weeks    Status  New    Target Date  03/10/19      PT LONG TERM GOAL #4   Title  Pt will perform 5xSTS without UE support in <12 sec to demonstrate increase LE power.    Baseline  5xSTS 16sec without UE support 01/08/2019    Time  8    Period  Weeks    Status  New    Target Date  03/10/19      PT LONG TERM  GOAL #5   Title  Pt will perform 25 heel raises on B LE to demonstrate increased plantarflexor endurance for safety with gait.    Baseline  Heel raise test R LE 3 reps, L LE 8 reps, with significant WB through B LE 01/08/2019    Time  8    Period  Weeks    Status  New    Target Date  03/10/19      Additional Long Term Goals   Additional Long Term Goals  Yes      PT LONG TERM GOAL #6   Title  Pt will increase Berg score to at least 53/56 to demonstrate decrease fall risk    Baseline  Pt Berg score 48/56 01/19/2019    Time  4    Period  Weeks    Status  New    Target Date  02/16/19            Plan - 02/05/19 1043    Clinical Impression Statement  Pt shoulder AROM WFL and pain-free B.  Due to delayed onset of muscle soreness, therapy focused primarily on shoulder PROM/ stretching and light resistance RTB exercises.  Pt R shoulder abduction wtih RTB resistance painful >90 degrees, cued to perform exercise in pain-free range <90 with additional cueing to reduce R upper trap compensation that reduced with decreased range.  Pt will benefit from further skilled therapy to increase UE strength and pain-free ROM, increase LE strength, and improve balance.    Personal Factors and Comorbidities  Age    Examination-Activity Limitations  AnimatorLocomotion Level;Reach Overhead    Examination-Participation Restrictions  Community Activity;Yard Work    Conservation officer, historic buildingstability/Clinical Decision Making  Evolving/Moderate complexity    Clinical Decision Making  Moderate    Rehab Potential  Good    PT Frequency  2x / week    PT Duration  8 weeks    PT Treatment/Interventions  ADLs/Self Care Home Management;Biofeedback;Cryotherapy;Electrical Stimulation;Moist Heat;Traction;DME Instruction;Gait training;Stair training;Functional mobility  training;Therapeutic activities;Therapeutic exercise;Balance training;Neuromuscular re-education;Patient/family education;Orthotic Fit/Training;Manual techniques;Passive range of motion;Energy  conservation    PT Next Visit Plan  LE strengthening (hip flexor focus), progress UE strengthening, AROM C-spine    PT Home Exercise Plan  Access Code: [email or web address]    Consulted and Agree with Plan of Care  Patient       Patient will benefit from skilled therapeutic intervention in order to improve the following deficits and impairments:  Abnormal gait, Impaired sensation, Improper body mechanics, Pain, Decreased coordination, Decreased mobility, Postural dysfunction, Decreased activity tolerance, Decreased endurance, Decreased range of motion, Decreased strength, Hypomobility, Impaired UE functional use, Difficulty walking, Impaired flexibility, Decreased balance  Visit Diagnosis: Antalgic gait  Decreased right shoulder range of motion  Muscle weakness (generalized)  Pain in both upper extremities  Other abnormalities of gait and mobility     Problem List There are no active problems to display for this patient.  Pura Spice, PT, DPT # [id number] Chinita Greenland, SPT 02/05/2019, 3:55 PM  [place] [facility] [address] [place], [place], [address] Phone: [phone]   Fax:  [phone]  Name: Lonnie Rodriguez MRN: [id number] Date of Birth: 1951/02/25

## 2019-02-04 NOTE — Patient Instructions (Signed)
Access Code: [facility]  URL: [email or web address]  Date: 02/04/2019  Prepared by: Dorcas Carrow   Exercises  Standing Single Arm Shoulder Abduction with Resistance - 20 reps - 2 sets - 1x daily - 4x weekly  Standing Shoulder Flexion with Resistance - 20 reps - 2 sets - 1x daily - 4x weekly

## 2019-02-09 ENCOUNTER — Other Ambulatory Visit: Payer: Self-pay

## 2019-02-09 ENCOUNTER — Encounter: Payer: Self-pay | Admitting: Physical Therapy

## 2019-02-09 ENCOUNTER — Ambulatory Visit: Payer: Medicare Other | Admitting: Physical Therapy

## 2019-02-09 DIAGNOSIS — R2689 Other abnormalities of gait and mobility: Secondary | ICD-10-CM | POA: Diagnosis not present

## 2019-02-09 DIAGNOSIS — M25611 Stiffness of right shoulder, not elsewhere classified: Secondary | ICD-10-CM

## 2019-02-09 DIAGNOSIS — M79602 Pain in left arm: Secondary | ICD-10-CM

## 2019-02-09 DIAGNOSIS — M6281 Muscle weakness (generalized): Secondary | ICD-10-CM

## 2019-02-09 DIAGNOSIS — M79601 Pain in right arm: Secondary | ICD-10-CM

## 2019-02-09 NOTE — Therapy (Signed)
[facility] [address] [place], [place], [address] Phone: [phone]   Fax:  [phone]  Physical Therapy Treatment  Patient Details  Name: Lonnie Rodriguez MRN: [id number] Date of Birth: 31-May-1950 Referring Provider (PT): Deetta Perla, MD   Encounter Date: 02/09/2019  PT End of Session - 02/09/19 1209    Visit Number  8    Number of Visits  16    Date for PT Re-Evaluation  03/10/19    Authorization - Visit Number  8    Authorization - Number of Visits  10    PT Start Time  0816    PT Stop Time  0911    PT Time Calculation (min)  55 min    Equipment Utilized During Treatment  Gait belt    Activity Tolerance  Patient tolerated treatment well;No increased pain    Behavior During Therapy  [facility] for tasks assessed/performed       History reviewed. No pertinent past medical history.  History reviewed. No pertinent surgical history.  There were no vitals filed for this visit.  Subjective Assessment - 02/09/19 0820    Subjective  Pt reports soreness with R shoulder abduction currently 2/10; worst 3/10.  Pt reports hip pain that is exacerbated with walking; current hip pain 2/10, worst 4/10.  Pt reports putting together furniture for daughter this weekend that required lots of getting up and down off of knees and standing.  Pt reports doing HEP and altering abduction exercises to maintain pain-free range.  Pt reports ibuprofen and ice helps.  Pt reports occasionally taking a muscle relaxer for neck stiffness.    Pertinent History  Pt reports he had home health post-surgery and that his HEP includes heel raises with UE support, hip abduction with UE support, marching with UE support, and squats with UE support.    Limitations  Walking;House hold activities    Patient Stated Goals  ambulate without cane, be able to lift R arm without pain    Currently in Pain?  Yes    Pain Score  2     Pain Location  Shoulder    Pain Orientation  Right     Pain Descriptors / Indicators  Sore    Pain Onset  In the past 7 days    Aggravating Factors   abduction end range    Pain Relieving Factors  Ibuprofen, rest, ice    Multiple Pain Sites  Yes    Pain Score  2    Pain Location  Hip    Pain Orientation  Right    Pain Descriptors / Indicators  Sore    Pain Onset  In the past 7 days    Aggravating Factors   Walking    Pain Relieving Factors  Eases with continued walking       Therapeutic Ex: Nustep L4 x10 Sit<>stand staggered - 2x8 B Standing hip abduction 4# ankle weights - 2x20 Standing hip extension 4# ankle weights - 2x20 Lateral stepping in // bars 4# ankle weights - 2x20 B UE AROM abduction, flexion, ER - x3 WFL, no pain  Manual Therapy: Supine hamstring stretching - 2x30 sec hold B    PT Long Term Goals - 01/19/19 0933      PT LONG TERM GOAL #1   Title  Pt will score 55 on FOTO to indicate improved functional mobility    Baseline  FOTO score 45 01/13/2019    Time  8  Period  Weeks    Status  New    Target Date  03/10/19      PT LONG TERM GOAL #2   Title  Pt will increase cervical and R UE ROM to [facility] with 0/10 pain in order to be able to reach overhead to complete ADLs.    Baseline  Cervical AROM grossly limited (Extension: 20deg, L rotn: 20 deg, R rotn: 28 deg, L/R lat flex: 20 deg), UE AROM WNL except R shoulder flexion (120 deg), abduction (94 deg), and external rotation (76 deg) pain limited 01/13/2019    Time  8    Period  Weeks    Status  New    Target Date  03/10/19      PT LONG TERM GOAL #3   Title  Pt will ambulate with LRD for 5 minutes with improved upright posture and a gait speed of at least 1.168m/s to improve community participation.    Baseline  Gait speed with no AD is 0.5471m/s, with SPC 0.8337m/s, with forward head posture and rounded shoulders 01/08/2019    Time  8    Period  Weeks    Status  New    Target Date  03/10/19      PT LONG TERM GOAL #4   Title  Pt will perform 5xSTS without UE support in  <12 sec to demonstrate increase LE power.    Baseline  5xSTS 16sec without UE support 01/08/2019    Time  8    Period  Weeks    Status  New    Target Date  03/10/19      PT LONG TERM GOAL #5   Title  Pt will perform 25 heel raises on B LE to demonstrate increased plantarflexor endurance for safety with gait.    Baseline  Heel raise test R LE 3 reps, L LE 8 reps, with significant WB through B LE 01/08/2019    Time  8    Period  Weeks    Status  New    Target Date  03/10/19      Additional Long Term Goals   Additional Long Term Goals  Yes      PT LONG TERM GOAL #6   Title  Pt will increase Berg score to at least 53/56 to demonstrate decrease fall risk    Baseline  Pt Berg score 48/56 01/19/2019    Time  4    Period  Weeks    Status  New    Target Date  02/16/19            Plan - 02/09/19 1211    Clinical Impression Statement  Pt demonstrates L hip drop during R stance with ambulation (with and without AD).  Pt requires cueing for hip stabilization and posture with LE exercises with ankle weights.  Pt educated on staggered sit<>stand technique for HEP.  Pt has limited hip extension and limited hamstring length B.  Pt will benefit from further skilled therapy to increase LE strength, improve balance/ gait mechanics, and increase UE pain-free ROM.    Personal Factors and Comorbidities  Age    Examination-Activity Limitations  AnimatorLocomotion Level;Reach Overhead    Examination-Participation Restrictions  Community Activity;Yard Work    Conservation officer, historic buildingstability/Clinical Decision Making  Evolving/Moderate complexity    Clinical Decision Making  Moderate    Rehab Potential  Good    PT Frequency  2x / week    PT Duration  8 weeks    PT Treatment/Interventions  ADLs/Self Care Home Management;Biofeedback;Cryotherapy;Electrical Stimulation;Moist Heat;Traction;DME Instruction;Gait training;Stair training;Functional mobility training;Therapeutic activities;Therapeutic exercise;Balance training;Neuromuscular  re-education;Patient/family education;Orthotic Fit/Training;Manual techniques;Passive range of motion;Energy conservation    PT Next Visit Plan  LE strengthening (hip flexor, glut med focus), progress UE strengthening, balance    PT Home Exercise Plan  Access Code: [email or web address]    Consulted and Agree with Plan of Care  Patient       Patient will benefit from skilled therapeutic intervention in order to improve the following deficits and impairments:  Abnormal gait, Impaired sensation, Improper body mechanics, Pain, Decreased coordination, Decreased mobility, Postural dysfunction, Decreased activity tolerance, Decreased endurance, Decreased range of motion, Decreased strength, Hypomobility, Impaired UE functional use, Difficulty walking, Impaired flexibility, Decreased balance  Visit Diagnosis: Antalgic gait  Decreased right shoulder range of motion  Muscle weakness (generalized)  Pain in both upper extremities  Other abnormalities of gait and mobility     Problem List There are no active problems to display for this patient.  Cammie Mcgee, PT, DPT # [id number] Ricarda Frame, SPT 02/10/2019, 8:46 AM  [facility] [address] [place], [place], [address] Phone: [phone]   Fax:  [phone]  Name: Lonnie Rodriguez MRN: [id number] Date of Birth: 10-31-1950

## 2019-02-11 ENCOUNTER — Encounter: Payer: Self-pay | Admitting: Physical Therapy

## 2019-02-11 ENCOUNTER — Ambulatory Visit: Payer: Medicare Other | Admitting: Physical Therapy

## 2019-02-11 ENCOUNTER — Other Ambulatory Visit: Payer: Self-pay

## 2019-02-11 DIAGNOSIS — M6281 Muscle weakness (generalized): Secondary | ICD-10-CM

## 2019-02-11 DIAGNOSIS — R2689 Other abnormalities of gait and mobility: Secondary | ICD-10-CM | POA: Diagnosis not present

## 2019-02-11 DIAGNOSIS — M79601 Pain in right arm: Secondary | ICD-10-CM

## 2019-02-11 DIAGNOSIS — M25611 Stiffness of right shoulder, not elsewhere classified: Secondary | ICD-10-CM

## 2019-02-11 NOTE — Therapy (Signed)
[facility] [address] [place], [place], [address] Phone: [phone]   Fax:  [phone]  Physical Therapy Treatment  Patient Details  Name: Lonnie Rodriguez MRN: [id number] Date of Birth: 1950-12-28 Referring Provider (PT): Deetta Perla, MD   Encounter Date: 02/11/2019  PT End of Session - 02/11/19 1622    Visit Number  9    Number of Visits  16    Date for PT Re-Evaluation  03/10/19    Authorization - Visit Number  9    Authorization - Number of Visits  10    Equipment Utilized During Treatment  Gait belt    Activity Tolerance  Patient tolerated treatment well;No increased pain    Behavior During Therapy  [facility] for tasks assessed/performed       History reviewed. No pertinent past medical history.  History reviewed. No pertinent surgical history.  There were no vitals filed for this visit.  Subjective Assessment - 02/11/19 0839    Subjective  Pt reports 2/10 pain in R shoulder.  Pt reports doing some light lifting for chores that is sometimes painful.  Pt reports no pain in hip today.  Pt states he has been walking and performing HEP.    Pertinent History  Pt reports he had home health post-surgery and that his HEP includes heel raises with UE support, hip abduction with UE support, marching with UE support, and squats with UE support.    Limitations  Walking;House hold activities    Patient Stated Goals  ambulate without cane, be able to lift R arm without pain    Currently in Pain?  Yes    Pain Score  2     Pain Location  Shoulder    Pain Orientation  Right    Pain Descriptors / Indicators  Discomfort    Pain Onset  In the past 7 days    Multiple Pain Sites  No         [facility] PT Assessment - 02/11/19 0001      Balance   Balance Assessed  Yes      Standardized Balance Assessment   Standardized Balance Assessment  Berg Balance Test      Berg Balance Test   Sit to Stand  Able to stand without using hands and  stabilize independently    Standing Unsupported  Able to stand safely 2 minutes    Sitting with Back Unsupported but Feet Supported on Floor or Stool  Able to sit safely and securely 2 minutes    Stand to Sit  Sits safely with minimal use of hands    Transfers  Able to transfer safely, minor use of hands    Standing Unsupported with Eyes Closed  Able to stand 10 seconds safely    Standing Unsupported with Feet Together  Able to place feet together independently and stand 1 minute safely    From Standing, Reach Forward with Outstretched Arm  Can reach confidently >25 cm (10")    From Standing Position, Pick up Object from Floor  Able to pick up shoe, needs supervision    From Standing Position, Turn to Look Behind Over each Shoulder  Looks behind from both sides and weight shifts well    Turn 360 Degrees  Able to turn 360 degrees safely in 4 seconds or less    Standing Unsupported, Alternately Place Feet on Step/Stool  Able to stand independently and safely and complete 8 steps in 20 seconds  Standing Unsupported, One Foot in [place] to place foot tandem independently and hold 30 seconds    Standing on One Leg  Able to lift leg independently and hold equal to or more than 3 seconds    Total Score  53       Heel Raise Test: R LE 8 reps, L LE 10 reps, with moderate WB through B UE and decreased ROM B   Gait speed with no AD: 1.06 m/s   Berg: 53/56  Therapeutic Exercise: Pulley AAROM abduction/ flexion - 2x15 with 5 second holds Marching in // bars with 5# ankle weights - x4 Lateral walking in // bars with 5# ankle weights - x4 Nustep L5 x10 min    PT Long Term Goals - 02/11/19 1752      PT LONG TERM GOAL #1   Title  Pt will score 55 on FOTO to indicate improved functional mobility    Baseline  FOTO score 45 01/13/2019    Time  8    Period  Weeks    Status  On-going    Target Date  03/10/19      PT LONG TERM GOAL #2   Title  Pt will increase cervical and R UE ROM to [facility]  with 0/10 pain in order to be able to reach overhead to complete ADLs.    Baseline  Cervical AROM grossly limited (Extension: 20deg, L rotn: 20 deg, R rotn: 28 deg, L/R lat flex: 20 deg), UE AROM WNL except R shoulder flexion (120 deg), abduction (94 deg), and external rotation (76 deg) pain limited 01/13/2019    Time  8    Period  Weeks    Status  On-going    Target Date  03/10/19      PT LONG TERM GOAL #3   Title  Pt will ambulate with LRD for 5 minutes with improved upright posture and a gait speed of at least 1.70ms to improve community participation.    Baseline  Gait speed with no AD is 0.876m, with SPC 0.85m58m with forward head posture and rounded shoulders 01/08/2019; Gait speed with no AD is 1.06 m/s 02/11/2019    Time  8    Period  Weeks    Status  Partially Met    Target Date  03/10/19      PT LONG TERM GOAL #4   Title  Pt will perform 5xSTS without UE support in <12 sec to demonstrate increase LE power.    Baseline  5xSTS 16sec without UE support 01/08/2019    Time  8    Period  Weeks    Status  On-going    Target Date  03/10/19      PT LONG TERM GOAL #5   Title  Pt will perform 25 heel raises on B LE to demonstrate increased plantarflexor endurance for safety with gait.    Baseline  Heel raise test R LE 3 reps, L LE 8 reps, with significant WB through B LE 01/08/2019; Heel Raise Test: R LE 8 reps, L LE 10 reps, with moderate WB through B UE and decreased ROM B 02/11/2019    Time  8    Period  Weeks    Status  On-going    Target Date  03/10/19      PT LONG TERM GOAL #6   Title  Pt will increase Berg score to at least 53/56 to demonstrate decrease fall risk    Baseline  Pt Berg score  48/56 01/19/2019; Pt Berg score 53/56 02/11/2019    Time  4    Period  Weeks    Status  Achieved    Target Date  02/11/19            Plan - 02/11/19 1745    Clinical Impression Statement  Pt achieved Berg (53/56) goal today.  Pt also demonstrated goal progression with gait speed (1.06 m/s).   Pt demonstrates pain at end range abduction AROM and AAROM.  Pt will benefit from further skilled therapy to increase LE strength, improve balance/ gait mechanics, and increase UE ROM for pain-free functional mobility.    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  Evolving/Moderate complexity    Clinical Decision Making  Moderate    Rehab Potential  Good    PT Frequency  2x / week    PT Duration  8 weeks    PT Treatment/Interventions  ADLs/Self Care Home Management;Biofeedback;Cryotherapy;Electrical Stimulation;Moist Heat;Traction;DME Instruction;Gait training;Stair training;Functional mobility training;Therapeutic activities;Therapeutic exercise;Balance training;Neuromuscular re-education;Patient/family education;Orthotic Fit/Training;Manual techniques;Passive range of motion;Energy conservation    PT Next Visit Plan  Progress LE/ UE strengthening, balance; FOTO, HEEL RAISE TEST    PT Home Exercise Plan  Access Code: [email or web address]    Consulted and Agree with Plan of Care  Patient       Patient will benefit from skilled therapeutic intervention in order to improve the following deficits and impairments:  Abnormal gait, Impaired sensation, Improper body mechanics, Pain, Decreased coordination, Decreased mobility, Postural dysfunction, Decreased activity tolerance, Decreased endurance, Decreased range of motion, Decreased strength, Hypomobility, Impaired UE functional use, Difficulty walking, Impaired flexibility, Decreased balance  Visit Diagnosis: Antalgic gait  Decreased right shoulder range of motion  Muscle weakness (generalized)  Pain in both upper extremities  Other abnormalities of gait and mobility     Problem List There are no active problems to display for this patient.  Pura Spice, PT, DPT #  [id number] Chinita Greenland, SPT 02/11/2019, 5:58 PM  [place] [address] [place], [place], [address] Phone: [phone]   Fax:  [phone]  Name: Lonnie Rodriguez MRN: [id number] Date of Birth: 07-17-1950

## 2019-02-16 ENCOUNTER — Encounter: Payer: Self-pay | Admitting: Physical Therapy

## 2019-02-16 ENCOUNTER — Ambulatory Visit: Payer: Medicare Other | Admitting: Physical Therapy

## 2019-02-16 DIAGNOSIS — R2689 Other abnormalities of gait and mobility: Secondary | ICD-10-CM

## 2019-02-16 DIAGNOSIS — M79601 Pain in right arm: Secondary | ICD-10-CM

## 2019-02-16 DIAGNOSIS — M25611 Stiffness of right shoulder, not elsewhere classified: Secondary | ICD-10-CM

## 2019-02-16 DIAGNOSIS — M6281 Muscle weakness (generalized): Secondary | ICD-10-CM

## 2019-02-16 DIAGNOSIS — M79602 Pain in left arm: Secondary | ICD-10-CM

## 2019-02-16 NOTE — Therapy (Signed)
[facility] [address] [place], [place], [address] Phone: [phone]   Fax:  [phone]  Physical Therapy Treatment Prognosis 9/9 to 02/16/2019  Patient Details  Name: Lonnie Rodriguez MRN: [id number] Date of Birth: 1950-12-10 Referring Provider (PT): Deetta Perla, MD     Encounter Date: 02/16/2019  PT End of Session - 02/16/19 1249    Visit Number  10    Number of Visits  16    Date for PT Re-Evaluation  03/10/19    Authorization - Visit Number  1    Authorization - Number of Visits  10    PT Start Time  0800    PT Stop Time  1448    PT Time Calculation (min)  42 min    Equipment Utilized During Treatment  Gait belt    Activity Tolerance  Patient tolerated treatment well;No increased pain    Behavior During Therapy  [facility] for tasks assessed/performed       History reviewed. No pertinent past medical history.  History reviewed. No pertinent surgical history.  There were no vitals filed for this visit.  Subjective Assessment - 02/16/19 0815    Subjective  Pt reports no pain today.  Pt reports doing HEP and walking at home without cane.    Pertinent History  Pt reports he had home health post-surgery and that his HEP includes heel raises with UE support, hip abduction with UE support, marching with UE support, and squats with UE support.    Limitations  Walking;House hold activities    Patient Stated Goals  ambulate without cane, be able to lift R arm without pain    Currently in Pain?  No/denies         5xSTS:13.6s   Therapeutic Exercise: Standing UE AROM flexion, abduction (pain at abd end range) Seated heel raises - 2x10  Standing marching, hip abduction, hip extension with 5# ankle weights - 2x20 each Standing B heel raises - 1x20 Standing single leg heel raises - 1x5 B Nustep L5 x10 min     PT Long Term Goals - 02/16/19 1232      PT LONG TERM GOAL #1   Title  Pt will score 55 on FOTO to indicate  improved functional mobility    Baseline  FOTO score 45 01/13/2019    Time  8    Period  Weeks    Status  On-going    Target Date  03/10/19      PT LONG TERM GOAL #2   Title  Pt will increase cervical and R UE ROM to [facility] with 0/10 pain in order to be able to reach overhead to complete ADLs.    Baseline  Cervical AROM grossly limited (Extension: 20deg, L rotn: 20 deg, R rotn: 28 deg, L/R lat flex: 20 deg), UE AROM WNL except R shoulder flexion (120 deg), abduction (94 deg), and external rotation (76 deg) pain limited 01/13/2019    Time  8    Period  Weeks    Status  On-going    Target Date  03/10/19      PT LONG TERM GOAL #3   Title  Pt will ambulate with LRD for 5 minutes with improved upright posture and a gait speed of at least 1.38ms to improve community participation.    Baseline  Gait speed with no AD is 0.871m, with SPC 0.74m35m with forward head posture and rounded shoulders 01/08/2019; Gait speed with no AD is 1.06  m/s 02/11/2019    Time  8    Period  Weeks    Status  Partially Met    Target Date  03/10/19      PT LONG TERM GOAL #4   Title  Pt will perform 5xSTS without UE support in <12 sec to demonstrate increase LE power.    Baseline  5xSTS 16sec without UE support 01/08/2019; 5xSTS 13.6 sec without UE support 02/16/2019    Time  8    Period  Weeks    Status  Partially Met    Target Date  03/10/19      PT LONG TERM GOAL #5   Title  Pt will perform 25 heel raises on B LE to demonstrate increased plantarflexor endurance for safety with gait.    Baseline  Heel raise test R LE 3 reps, L LE 8 reps, with significant WB through B LE 01/08/2019; Heel Raise Test: R LE 8 reps, L LE 10 reps, with moderate WB through B UE and decreased ROM B 02/11/2019    Time  8    Period  Weeks    Status  Not Met    Target Date  03/10/19      PT LONG TERM GOAL #6   Title  Pt will increase Berg score to at least 53/56 to demonstrate decrease fall risk    Baseline  Pt Berg score 48/56 01/19/2019; Pt Berg  score 53/56 02/11/2019    Time  4    Period  Weeks    Status  Achieved          Plan - 02/16/19 1246    Clinical Impression Statement  Pt shows improvement with LE power (5xSTS: 13.6sec).  Pt has pain at end range shoulder abduction AROM.  Pt tolerated LE strengthening with less sitting breaks today.  Pt. ambulates with more consistent step pattern/ cadence with and without use of assistive device while ambulating in clinic.  See updated goals.  Pt will benefit from further skilled therapy to increase LE strength, increase UE pain-free ROM, and improve balance/ gait mechanics.    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  Evolving/Moderate complexity    Clinical Decision Making  Moderate    Rehab Potential  Good    PT Frequency  2x / week    PT Duration  8 weeks    PT Treatment/Interventions  ADLs/Self Care Home Management;Biofeedback;Cryotherapy;Electrical Stimulation;Moist Heat;Traction;DME Instruction;Gait training;Stair training;Functional mobility training;Therapeutic activities;Therapeutic exercise;Balance training;Neuromuscular re-education;Patient/family education;Orthotic Fit/Training;Manual techniques;Passive range of motion;Energy conservation    PT Next Visit Plan  Progress LE/ UE strengthening, balance; FOTO, CERVICAL/ UE AROM    PT Home Exercise Plan  Access Code: [email or web address]    Consulted and Agree with Plan of Care  Patient       Patient will benefit from skilled therapeutic intervention in order to improve the following deficits and impairments:  Abnormal gait, Impaired sensation, Improper body mechanics, Pain, Decreased coordination, Decreased mobility, Postural dysfunction, Decreased activity tolerance, Decreased endurance, Decreased range of motion, Decreased strength, Hypomobility, Impaired UE  functional use, Difficulty walking, Impaired flexibility, Decreased balance  Visit Diagnosis: Antalgic gait  Decreased right shoulder range of motion  Muscle weakness (generalized)  Pain in both upper extremities  Other abnormalities of gait and mobility     Problem List There are no active problems to display for this patient.  Pura Spice, PT, DPT # [id number] Chinita Greenland, SPT 02/17/2019, 8:01 AM  [place] [facility] [address] [place], [place], [address] Phone: [phone]   Fax:  [phone]  Name: Lonnie Rodriguez MRN: [id number] Date of Birth: 1950-10-13

## 2019-02-18 ENCOUNTER — Other Ambulatory Visit: Payer: Self-pay

## 2019-02-18 ENCOUNTER — Encounter: Payer: Self-pay | Admitting: Physical Therapy

## 2019-02-18 ENCOUNTER — Ambulatory Visit: Payer: Medicare Other | Admitting: Physical Therapy

## 2019-02-18 DIAGNOSIS — M25611 Stiffness of right shoulder, not elsewhere classified: Secondary | ICD-10-CM

## 2019-02-18 DIAGNOSIS — M6281 Muscle weakness (generalized): Secondary | ICD-10-CM

## 2019-02-18 DIAGNOSIS — M79601 Pain in right arm: Secondary | ICD-10-CM

## 2019-02-18 DIAGNOSIS — M79602 Pain in left arm: Secondary | ICD-10-CM

## 2019-02-18 DIAGNOSIS — R2689 Other abnormalities of gait and mobility: Secondary | ICD-10-CM

## 2019-02-18 NOTE — Therapy (Signed)
[facility] [address] [place], [place], [address] Phone: [phone]   Fax:  [phone]  Physical Therapy Treatment  Patient Details  Name: Lonnie Rodriguez MRN: [id number] Date of Birth: 1951-01-13 Referring Provider (PT): Deetta Perla, MD   Encounter Date: 02/18/2019  PT End of Session - 02/18/19 0815    Visit Number  11    Number of Visits  16    Date for PT Re-Evaluation  03/10/19    Authorization - Visit Number  2    Authorization - Number of Visits  10    PT Start Time  0805    PT Stop Time  9735    PT Time Calculation (min)  50 min    Equipment Utilized During Treatment  Gait belt    Activity Tolerance  Patient tolerated treatment well;No increased pain    Behavior During Therapy  [facility] for tasks assessed/performed       History reviewed. No pertinent past medical history.  History reviewed. No pertinent surgical history.  There were no vitals filed for this visit.  Subjective Assessment - 02/18/19 0815    Subjective  Pt reports 3/10 pain in R shoulder.  Pt reports doing HEP and walking at home without cane.  Pt has swollen R middle finger that has been aching and swollen over past day or so.    Pertinent History  Pt reports he had home health post-surgery and that his HEP includes heel raises with UE support, hip abduction with UE support, marching with UE support, and squats with UE support.    Limitations  Walking;House hold activities    Patient Stated Goals  ambulate without cane, be able to lift R arm without pain    Currently in Pain?  Yes    Pain Score  3     Pain Location  Shoulder    Pain Orientation  Right    Pain Descriptors / Indicators  Discomfort         Therapeutic Exercise: Sit<>stand - 1x8 Staggered sit<>stand - 1x8 B R UE AROM flexion, abduction - Pain at ~150 degrees AROM abduction RTB seated abduction, flexion, ER - 2x10 each (abduction in pain-free range on R UE <90 degrees) RTB anchored  scap retractions, B extension, B flexion Gait through clinic - x10 laps (antalgic gait with decreased R LE stance and L LE step length; pt cued for consistent heel strike with sufficient L LE foot clearance)   FOTO: 49 (age norm 59)     PT Long Term Goals - 02/16/19 1232      PT LONG TERM GOAL #1   Title  Pt will score 55 on FOTO to indicate improved functional mobility    Baseline  FOTO score 45 01/13/2019    Time  8    Period  Weeks    Status  On-going    Target Date  03/10/19      PT LONG TERM GOAL #2   Title  Pt will increase cervical and R UE ROM to [facility] with 0/10 pain in order to be able to reach overhead to complete ADLs.    Baseline  Cervical AROM grossly limited (Extension: 20deg, L rotn: 20 deg, R rotn: 28 deg, L/R lat flex: 20 deg), UE AROM WNL except R shoulder flexion (120 deg), abduction (94 deg), and external rotation (76 deg) pain limited 01/13/2019    Time  8    Period  Weeks    Status  On-going    Target Date  03/10/19      PT LONG TERM GOAL #3   Title  Pt will ambulate with LRD for 5 minutes with improved upright posture and a gait speed of at least 1.83ms to improve community participation.    Baseline  Gait speed with no AD is 0.864m, with SPC 0.9m45m with forward head posture and rounded shoulders 01/08/2019; Gait speed with no AD is 1.06 m/s 02/11/2019    Time  8    Period  Weeks    Status  Partially Met    Target Date  03/10/19      PT LONG TERM GOAL #4   Title  Pt will perform 5xSTS without UE support in <12 sec to demonstrate increase LE power.    Baseline  5xSTS 16sec without UE support 01/08/2019; 5xSTS 13.6 sec without UE support 02/16/2019    Time  8    Period  Weeks    Status  Partially Met    Target Date  03/10/19      PT LONG TERM GOAL #5   Title  Pt will perform 25 heel raises on B LE to demonstrate increased plantarflexor endurance for safety with gait.    Baseline  Heel raise test R LE 3 reps, L LE 8 reps, with significant WB through B LE  01/08/2019; Heel Raise Test: R LE 8 reps, L LE 10 reps, with moderate WB through B UE and decreased ROM B 02/11/2019    Time  8    Period  Weeks    Status  Not Met    Target Date  03/10/19      PT LONG TERM GOAL #6   Title  Pt will increase Berg score to at least 53/56 to demonstrate decrease fall risk    Baseline  Pt Berg score 48/56 01/19/2019; Pt Berg score 53/56 02/11/2019    Time  4    Period  Weeks    Status  Achieved            Plan - 02/18/19 0857    Clinical Impression Statement  Pt reports pain at ~150 degrees of R sh. AROM; we spent time reviewing HEP UE band exercises to ensure proper technique.  Pt instructed to perform RTB seated abduction in pain-free range below 90 deg..  Marland Kitchent with stairs demonstrates reciprocal pattern ascending, and step to pattern descending; UE support throughout.  Gait through clinic reveals antalgic gait with decreased R LE stance and L LE step length; pt cued for consistent heel strike with sufficient L LE foot clearance.  Pt FOTO indicates no improvement in functional mobility since last taken value; however, FOTO evaluation will be altered to address appropriate UE/ LE restrictions being addressed to accurately demonstrate improvement in functional mobility.  Pt reports feeling 60% improved since beginning PT with significant improvements in balance and LE strength.  Pt will benefit from further skilled therapy to increase LE strength/ balance and improve UE strength/ pain-free ROM.    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    Clinical Decision Making  Moderate    Rehab Potential  Good    PT Frequency  2x / week    PT Duration  8 weeks    PT Treatment/Interventions  ADLs/Self Care Home Management;Biofeedback;Cryotherapy;Electrical  Stimulation;Moist Heat;Traction;DME Instruction;Gait training;Stair training;Functional mobility  training;Therapeutic activities;Therapeutic exercise;Balance training;Neuromuscular re-education;Patient/family education;Orthotic Fit/Training;Manual techniques;Passive range of motion;Energy conservation    PT Next Visit Plan  Progress LE/ UE strengthening, balance; FOTO change, CERVICAL/ UE AROM MEASUREMENT    PT Home Exercise Plan  Access Code: [email or web address]    Consulted and Agree with Plan of Care  Patient       Patient will benefit from skilled therapeutic intervention in order to improve the following deficits and impairments:  Abnormal gait, Impaired sensation, Improper body mechanics, Pain, Decreased coordination, Decreased mobility, Postural dysfunction, Decreased activity tolerance, Decreased endurance, Decreased range of motion, Decreased strength, Hypomobility, Impaired UE functional use, Difficulty walking, Impaired flexibility, Decreased balance  Visit Diagnosis: Antalgic gait  Decreased right shoulder range of motion  Muscle weakness (generalized)  Pain in both upper extremities  Other abnormalities of gait and mobility     Problem List There are no active problems to display for this patient.  Pura Spice, PT, DPT # [id number] Chinita Greenland, SPT 02/18/2019, 6:11 PM  [place] [facility] [address] [place], [place], [address] Phone: [phone]   Fax:  [phone]  Name: Lonnie Rodriguez MRN: [id number] Date of Birth: 1950/05/27

## 2019-02-18 NOTE — Patient Instructions (Signed)
Access Code: [email or web address]  URL: [email or web address]  Date: 02/18/2019  Prepared by: Dorcas Carrow   Exercises  Shoulder Extension with Resistance - 20 reps - 2 sets - 1x daily - 4x weekly  Standing Shoulder Flexion with Posterior Anchored Resistance - 20 reps - 2 sets - 1x daily - 4x weekly  Shoulder External Rotation and Scapular Retraction with Resistance - 20 reps - 2 sets - 1x daily - 4x weekly

## 2019-02-23 ENCOUNTER — Ambulatory Visit: Payer: Medicare Other | Admitting: Physical Therapy

## 2019-02-23 ENCOUNTER — Encounter: Payer: Self-pay | Admitting: Physical Therapy

## 2019-02-23 ENCOUNTER — Other Ambulatory Visit: Payer: Self-pay

## 2019-02-23 DIAGNOSIS — M6281 Muscle weakness (generalized): Secondary | ICD-10-CM

## 2019-02-23 DIAGNOSIS — R2689 Other abnormalities of gait and mobility: Secondary | ICD-10-CM | POA: Diagnosis not present

## 2019-02-23 DIAGNOSIS — M79602 Pain in left arm: Secondary | ICD-10-CM

## 2019-02-23 DIAGNOSIS — M25611 Stiffness of right shoulder, not elsewhere classified: Secondary | ICD-10-CM

## 2019-02-23 DIAGNOSIS — M79601 Pain in right arm: Secondary | ICD-10-CM

## 2019-02-23 NOTE — Therapy (Signed)
[place] [facility] [address]. [place], [place], [address] Phone: [phone]   Fax:  [phone]  Physical Therapy Treatment  Patient Details  Name: Lonnie Rodriguez MRN: [id number] Date of Birth: 1950-11-25 Referring Provider (PT): Deetta Perla, MD   Encounter Date: 02/23/2019  PT End of Session - 02/23/19 1252    Visit Number  12    Number of Visits  16    Date for PT Re-Evaluation  03/10/19    Authorization - Visit Number  3    Authorization - Number of Visits  10    PT Start Time  0802    PT Stop Time  1694    PT Time Calculation (min)  54 min    Equipment Utilized During Treatment  Gait belt    Activity Tolerance  Patient tolerated treatment well;No increased pain    Behavior During Therapy  [facility] for tasks assessed/performed       History reviewed. No pertinent past medical history.  History reviewed. No pertinent surgical history.  There were no vitals filed for this visit.  Subjective Assessment - 02/23/19 1738    Subjective  Pt reports no pain today.  Pt states he did a lot of yardwork this weekend with no increased LE pain and sufficient endurance and strength.  Pt reports performing HEP.    Pertinent History  Pt reports he had home health post-surgery and that his HEP includes heel raises with UE support, hip abduction with UE support, marching with UE support, and squats with UE support.    Limitations  Walking;House hold activities    Patient Stated Goals  ambulate without cane, be able to lift R arm without pain    Currently in Pain?  No/denies       Cervical AROM: Ext: 20 Flex: 20 Lat flex: L 25, R 15 Rot: L 32, R 38   Therapeutic Exercise: Nustep L5 x10 min  Cervical AROM - x5 UE AROM - x5 Cervical isometrics: flexion, extension, rotation, side-bending  - x3 with 5 sec hold each Cervical MET: flexion, extension, rotation - 3x5 with 5 sec hold each Supine chin tucks - 2x10 with 3-5 sec holds Pec stretch -  1x30 sec Seated chin tucks - 2x10 Seated self cervical isometrics education     PT Long Term Goals - 02/16/19 1232      PT LONG TERM GOAL #1   Title  Pt will score 55 on FOTO to indicate improved functional mobility    Baseline  FOTO score 45 01/13/2019    Time  8    Period  Weeks    Status  On-going    Target Date  03/10/19      PT LONG TERM GOAL #2   Title  Pt will increase cervical and R UE ROM to [facility] with 0/10 pain in order to be able to reach overhead to complete ADLs.    Baseline  Cervical AROM grossly limited (Extension: 20deg, L rotn: 20 deg, R rotn: 28 deg, L/R lat flex: 20 deg), UE AROM WNL except R shoulder flexion (120 deg), abduction (94 deg), and external rotation (76 deg) pain limited 01/13/2019    Time  8    Period  Weeks    Status  On-going    Target Date  03/10/19      PT LONG TERM GOAL #3   Title  Pt will ambulate with LRD for 5 minutes with improved upright posture and a gait  speed of at least 1.32ms to improve community participation.    Baseline  Gait speed with no AD is 0.843m, with SPC 0.86m1m with forward head posture and rounded shoulders 01/08/2019; Gait speed with no AD is 1.06 m/s 02/11/2019    Time  8    Period  Weeks    Status  Partially Met    Target Date  03/10/19      PT LONG TERM GOAL #4   Title  Pt will perform 5xSTS without UE support in <12 sec to demonstrate increase LE power.    Baseline  5xSTS 16sec without UE support 01/08/2019; 5xSTS 13.6 sec without UE support 02/16/2019    Time  8    Period  Weeks    Status  Partially Met    Target Date  03/10/19      PT LONG TERM GOAL #5   Title  Pt will perform 25 heel raises on B LE to demonstrate increased plantarflexor endurance for safety with gait.    Baseline  Heel raise test R LE 3 reps, L LE 8 reps, with significant WB through B LE 01/08/2019; Heel Raise Test: R LE 8 reps, L LE 10 reps, with moderate WB through B UE and decreased ROM B 02/11/2019    Time  8    Period  Weeks    Status  Not Met     Target Date  03/10/19      PT LONG TERM GOAL #6   Title  Pt will increase Berg score to at least 53/56 to demonstrate decrease fall risk    Baseline  Pt Berg score 48/56 01/19/2019; Pt Berg score 53/56 02/11/2019    Time  4    Period  Weeks    Status  Achieved          Plan - 02/23/19 1257    Clinical Impression Statement  Pt demonstrates full UE AROM B with ~10 deg lacking with R UE abduction in comparison to L with pain at end range.  Pt demonstrates gross cervical ROM limitations.  Pt responded well to cervical isometrics and MET with no pain; pt educated on self cervical isometrics for HEP.  Pt will benefit from further skilled therapy to increase cervical/ UE pain-free ROM, increase UE/ LE strength, and improve gait/ balance.    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    Clinical Decision Making  Moderate    Rehab Potential  Good    PT Frequency  2x / week    PT Duration  8 weeks    PT Treatment/Interventions  ADLs/Self Care Home Management;Biofeedback;Cryotherapy;Electrical Stimulation;Moist Heat;Traction;DME Instruction;Gait training;Stair training;Functional mobility training;Therapeutic activities;Therapeutic exercise;Balance training;Neuromuscular re-education;Patient/family education;Orthotic Fit/Training;Manual techniques;Passive range of motion;Energy conservation    PT Next Visit Plan  Cervical MET/ AROM, R UE MET/ AROM    PT Home Exercise Plan  Access Code: [email or web address] Consulted and Agree with Plan of Care  Patient       Patient will benefit from skilled therapeutic intervention in order to improve the following deficits and impairments:  Abnormal gait, Impaired sensation, Improper body mechanics, Pain, Decreased coordination, Decreased mobility, Postural  dysfunction, Decreased activity tolerance, Decreased endurance, Decreased range of motion, Decreased strength, Hypomobility, Impaired UE functional use, Difficulty walking, Impaired flexibility, Decreased balance  Visit Diagnosis: Antalgic gait  Decreased right shoulder  range of motion  Muscle weakness (generalized)  Pain in both upper extremities  Other abnormalities of gait and mobility     Problem List There are no active problems to display for this patient.  Pura Spice, PT, DPT # [id number] Chinita Greenland, SPT 02/23/2019, 5:40 PM  [place] [facility] [address] [place], [place], [address] Phone: [phone]   Fax:  [phone]  Name: JAVI BOLLMAN MRN: [id number] Date of Birth: 03-Mar-1951

## 2019-02-23 NOTE — Patient Instructions (Signed)
Access Code: [facility]  URL: [email or web address]  Date: 02/23/2019  Prepared by: Dorcas Carrow   Exercises  Standing Single Arm Shoulder Abduction with Resistance - 20 reps - 2 sets - 1x daily - 4x weekly  Standing Shoulder Flexion with Resistance - 20 reps - 2 sets - 1x daily - 4x weekly  Supine Chin Tuck - 10 reps - 1 sets - 5 seconds hold - 1x daily - 7x weekly  Seated Cervical Retraction - 10 reps - 1 sets - 5 seconds hold - 1x daily - 7x weekly  Seated Isometric Cervical Rotation - 10 reps - 1 sets - 5 seconds hold - 1x daily - 7x weekly  Standing Isometric Cervical Sidebending with Manual Resistance - 10 reps - 1 sets - 5 seconds hold - 1x daily - 7x weekly  Standing Isometric Cervical Flexion with Manual Resistance - 10 reps - 1 sets - 5 seconds hold - 1x daily - 7x weekly  Supine Single Arm Shoulder Protraction - 20 reps - 1 sets - 1x daily - 7x weekly

## 2019-02-25 ENCOUNTER — Ambulatory Visit: Payer: Medicare Other | Admitting: Physical Therapy

## 2019-02-25 ENCOUNTER — Encounter: Payer: Self-pay | Admitting: Physical Therapy

## 2019-02-25 ENCOUNTER — Other Ambulatory Visit: Payer: Self-pay

## 2019-02-25 DIAGNOSIS — M79602 Pain in left arm: Secondary | ICD-10-CM

## 2019-02-25 DIAGNOSIS — R2689 Other abnormalities of gait and mobility: Secondary | ICD-10-CM

## 2019-02-25 DIAGNOSIS — M6281 Muscle weakness (generalized): Secondary | ICD-10-CM

## 2019-02-25 DIAGNOSIS — M25611 Stiffness of right shoulder, not elsewhere classified: Secondary | ICD-10-CM

## 2019-02-25 DIAGNOSIS — M79601 Pain in right arm: Secondary | ICD-10-CM

## 2019-02-25 NOTE — Therapy (Signed)
[facility] [address]. [place], [place], [address] Phone: [phone]   Fax:  [phone]  Physical Therapy Treatment  Patient Details  Name: Lonnie Rodriguez MRN: [id number] Date of Birth: [birth year] Referring Provider (PT): Deetta Perla, MD   Encounter Date: 02/25/2019  PT End of Session - 02/25/19 0804    Visit Number  13    Number of Visits  16    Date for PT Re-Evaluation  03/10/19    Authorization - Visit Number  3    Authorization - Number of Visits  10    PT Start Time  5462    PT Stop Time  0845    PT Time Calculation (min)  46 min    Equipment Utilized During Treatment  Gait belt    Activity Tolerance  Patient tolerated treatment well;No increased pain    Behavior During Therapy  [facility] for tasks assessed/performed       History reviewed. No pertinent past medical history.  History reviewed. No pertinent surgical history.  There were no vitals filed for this visit.  Subjective Assessment - 02/25/19 0801    Subjective  Pt reports no pain today. Pt reports performing HEP.    Pertinent History  Pt reports he had home health post-surgery and that his HEP includes heel raises with UE support, hip abduction with UE support, marching with UE support, and squats with UE support.    Limitations  Walking;House hold activities    Patient Stated Goals  ambulate without cane, be able to lift R arm without pain    Currently in Pain?  No/denies        Therapeutic Exercise: Nustep L5 x12 min Staggered sit<>stand - 1x10 B Alternating stair taps - 2x10 B Alternating stair taps on foam - 2x10 B SLB stair taps on cones - 1x10 B SLB stair taps on cones on foam - 1x10 B Step over 6" plinth with foam on one side - 1x15  Seated UE AROM - x5 with pain at ~125 deg of abduction but able to reach full range Seated R UE inferior mobs in 90/ 100/ 115 deg abduction - 2x30 sec each Seated PROM - pt reports increase pain-free range post inf  mobs with pain at ~165     PT Education - 02/25/19 0804    Education Details  Pt educated on exercise technique    Person(s) Educated  Patient    Methods  Explanation;Demonstration    Comprehension  Verbalized understanding;Returned demonstration          PT Long Term Goals - 02/16/19 1232      PT LONG TERM GOAL #1   Title  Pt will score 55 on FOTO to indicate improved functional mobility    Baseline  FOTO score 45 01/13/2019    Time  8    Period  Weeks    Status  On-going    Target Date  03/10/19      PT LONG TERM GOAL #2   Title  Pt will increase cervical and R UE ROM to [facility] with 0/10 pain in order to be able to reach overhead to complete ADLs.    Baseline  Cervical AROM grossly limited (Extension: 20deg, L rotn: 20 deg, R rotn: 28 deg, L/R lat flex: 20 deg), UE AROM WNL except R shoulder flexion (120 deg), abduction (94 deg), and external rotation (76 deg) pain limited 01/13/2019    Time  8    Period  Weeks    Status  On-going    Target Date  03/10/19      PT LONG TERM GOAL #3   Title  Pt will ambulate with LRD for 5 minutes with improved upright posture and a gait speed of at least 1.68ms to improve community participation.    Baseline  Gait speed with no AD is 0.885m, with SPC 0.81m67m with forward head posture and rounded shoulders 01/08/2019; Gait speed with no AD is 1.06 m/s 02/11/2019    Time  8    Period  Weeks    Status  Partially Met    Target Date  03/10/19      PT LONG TERM GOAL #4   Title  Pt will perform 5xSTS without UE support in <12 sec to demonstrate increase LE power.    Baseline  5xSTS 16sec without UE support 01/08/2019; 5xSTS 13.6 sec without UE support 02/16/2019    Time  8    Period  Weeks    Status  Partially Met    Target Date  03/10/19      PT LONG TERM GOAL #5   Title  Pt will perform 25 heel raises on B LE to demonstrate increased plantarflexor endurance for safety with gait.    Baseline  Heel raise test R LE 3 reps, L LE 8 reps, with  significant WB through B LE 01/08/2019; Heel Raise Test: R LE 8 reps, L LE 10 reps, with moderate WB through B UE and decreased ROM B 02/11/2019    Time  8    Period  Weeks    Status  Not Met    Target Date  03/10/19      PT LONG TERM GOAL #6   Title  Pt will increase Berg score to at least 53/56 to demonstrate decrease fall risk    Baseline  Pt Berg score 48/56 01/19/2019; Pt Berg score 53/56 02/11/2019    Time  4    Period  Weeks    Status  Achieved            Plan - 02/25/19 1254    Clinical Impression Statement  Pt demonstrates pain in R UE abduction AROM at ~125 deg but is able to reach full range despite pain; improved after seated R shoulder inferior mobs in varying degrees of abduction (no pain until ~165 deg abd AROM).  Pt demonstrates increase LE strength/ balance with LE exercises.  Pt will benefit from further skilled therapy to increase LE strength, improve balance/ gait mechanics, and increase pain-free UE/ cervical ROM for ease with ADLs.    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    Clinical Decision Making  Moderate    Rehab Potential  Good    PT Frequency  2x / week    PT Duration  8 weeks    PT Treatment/Interventions  ADLs/Self Care Home Management;Biofeedback;Cryotherapy;Electrical Stimulation;Moist Heat;Traction;DME Instruction;Gait training;Stair training;Functional mobility training;Therapeutic activities;Therapeutic exercise;Balance training;Neuromuscular re-education;Patient/family education;Orthotic Fit/Training;Manual techniques;Passive range of motion;Energy conservation    PT Next Visit Plan  Progress LE strength/ balance with weights, inferior mobs with abduction, UE exercises    PT Home Exercise Plan  Access Code: [email or web address] Consulted and Agree with  Plan of Care  Patient       Patient will benefit from skilled therapeutic intervention in order  to improve the following deficits and impairments:  Abnormal gait, Impaired sensation, Improper body mechanics, Pain, Decreased coordination, Decreased mobility, Postural dysfunction, Decreased activity tolerance, Decreased endurance, Decreased range of motion, Decreased strength, Hypomobility, Impaired UE functional use, Difficulty walking, Impaired flexibility, Decreased balance  Visit Diagnosis: Antalgic gait  Decreased right shoulder range of motion  Muscle weakness (generalized)  Pain in both upper extremities  Other abnormalities of gait and mobility     Problem List There are no active problems to display for this patient.  Pura Spice, PT, DPT # [id number] Chinita Greenland, SPT 02/25/2019, 2:15 PM  [place] [facility] [address] [place], [place], [address] Phone: [phone]   Fax:  [phone]  Name: Lonnie Rodriguez MRN: [id number] Date of Birth: [birth year]

## 2019-03-02 ENCOUNTER — Encounter: Payer: Self-pay | Admitting: Physical Therapy

## 2019-03-02 ENCOUNTER — Ambulatory Visit: Payer: Medicare Other | Admitting: Physical Therapy

## 2019-03-02 ENCOUNTER — Other Ambulatory Visit: Payer: Self-pay

## 2019-03-02 DIAGNOSIS — M6281 Muscle weakness (generalized): Secondary | ICD-10-CM

## 2019-03-02 DIAGNOSIS — R2689 Other abnormalities of gait and mobility: Secondary | ICD-10-CM | POA: Diagnosis not present

## 2019-03-02 DIAGNOSIS — M25611 Stiffness of right shoulder, not elsewhere classified: Secondary | ICD-10-CM

## 2019-03-02 DIAGNOSIS — M79602 Pain in left arm: Secondary | ICD-10-CM

## 2019-03-02 DIAGNOSIS — M79601 Pain in right arm: Secondary | ICD-10-CM

## 2019-03-02 NOTE — Therapy (Signed)
[place] [facility] [address] [place], [place], [address] Phone: [phone]   Fax:  [phone]  Physical Therapy Treatment  Patient Details  Name: Lonnie Rodriguez MRN: [id number] Date of Birth: August 26, 1950 Referring Provider (PT): Deetta Perla, MD   Encounter Date: 03/02/2019  PT End of Session - 03/02/19 0811    Visit Number  14    Number of Visits  16    Date for PT Re-Evaluation  03/10/19    Authorization - Visit Number  5    Authorization - Number of Visits  10    PT Start Time  0803    PT Stop Time  0850    PT Time Calculation (min)  47 min    Equipment Utilized During Treatment  Gait belt    Activity Tolerance  Patient tolerated treatment well;No increased pain    Behavior During Therapy  [facility] for tasks assessed/performed       History reviewed. No pertinent past medical history.  History reviewed. No pertinent surgical history.  There were no vitals filed for this visit.  Subjective Assessment - 03/02/19 0809    Subjective  Pt reports no pain today, only stiffness in B LEs.  Pt reports performing HEP and lots of yardwork this weekend.    Pertinent History  Pt reports he had home health post-surgery and that his HEP includes heel raises with UE support, hip abduction with UE support, marching with UE support, and squats with UE support.    Limitations  Walking;House hold activities    Patient Stated Goals  ambulate without cane, be able to lift R arm without pain    Currently in Pain?  No/denies        Therapeutic Exercise: Nustep L5 x10 min Staggered sit<>stand - 1x5 B Sit <>stand on foam - 2x8 Alternating stair taps (10" step) on foam - 1x10 with unilateral UE support, 2x8 without UE support AROM cervical and B UE - 1x10 each direction  Cervical rotation (R 46 deg, L 47 deg)  Pain at end range R shoulder abduction Seated R shoulder abduction with inferior mob MWM - 2x10 with no pain at end range; pt educated on self  MWM with BTB     PT Education - 03/02/19 0810    Education Details  Pt educated on MWM with abduction    Person(s) Educated  Patient    Methods  Explanation;Demonstration    Comprehension  Verbalized understanding;Returned demonstration          PT Long Term Goals - 02/16/19 1232      PT LONG TERM GOAL #1   Title  Pt will score 55 on FOTO to indicate improved functional mobility    Baseline  FOTO score 45 01/13/2019    Time  8    Period  Weeks    Status  On-going    Target Date  03/10/19      PT LONG TERM GOAL #2   Title  Pt will increase cervical and R UE ROM to [facility] with 0/10 pain in order to be able to reach overhead to complete ADLs.    Baseline  Cervical AROM grossly limited (Extension: 20deg, L rotn: 20 deg, R rotn: 28 deg, L/R lat flex: 20 deg), UE AROM WNL except R shoulder flexion (120 deg), abduction (94 deg), and external rotation (76 deg) pain limited 01/13/2019    Time  8    Period  Weeks    Status  On-going    Target Date  03/10/19      PT LONG TERM GOAL #3   Title  Pt will ambulate with LRD for 5 minutes with improved upright posture and a gait speed of at least 1.52ms to improve community participation.    Baseline  Gait speed with no AD is 0.871m, with SPC 0.67m53m with forward head posture and rounded shoulders 01/08/2019; Gait speed with no AD is 1.06 m/s 02/11/2019    Time  8    Period  Weeks    Status  Partially Met    Target Date  03/10/19      PT LONG TERM GOAL #4   Title  Pt will perform 5xSTS without UE support in <12 sec to demonstrate increase LE power.    Baseline  5xSTS 16sec without UE support 01/08/2019; 5xSTS 13.6 sec without UE support 02/16/2019    Time  8    Period  Weeks    Status  Partially Met    Target Date  03/10/19      PT LONG TERM GOAL #5   Title  Pt will perform 25 heel raises on B LE to demonstrate increased plantarflexor endurance for safety with gait.    Baseline  Heel raise test R LE 3 reps, L LE 8 reps, with significant WB  through B LE 01/08/2019; Heel Raise Test: R LE 8 reps, L LE 10 reps, with moderate WB through B UE and decreased ROM B 02/11/2019    Time  8    Period  Weeks    Status  Not Met    Target Date  03/10/19      PT LONG TERM GOAL #6   Title  Pt will increase Berg score to at least 53/56 to demonstrate decrease fall risk    Baseline  Pt Berg score 48/56 01/19/2019; Pt Berg score 53/56 02/11/2019    Time  4    Period  Weeks    Status  Achieved            Plan - 03/02/19 0905    Clinical Impression Statement  Pt with full range B AROM, pain at end range abduction that dissipates with manual MWM from PT.  Pt cervical rotation AROM improved significantly (R 46, L 47) and pt reports "neck feels better" with HEP.  Pt demonstrates improvements in LE strength and balance.  Pt will benefit from further skilled therapy to increase R shoulder abduction pain-free AROM, increase LE strength/ balance, and improve gait mechanics.    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    Clinical Decision Making  Moderate    Rehab Potential  Good    PT Frequency  2x / week    PT Duration  8 weeks    PT Treatment/Interventions  ADLs/Self Care Home Management;Biofeedback;Cryotherapy;Electrical Stimulation;Moist Heat;Traction;DME Instruction;Gait training;Stair training;Functional mobility training;Therapeutic activities;Therapeutic exercise;Balance training;Neuromuscular re-education;Patient/family education;Orthotic Fit/Training;Manual techniques;Passive range of motion;Energy conservation    PT Next Visit Plan  Progress LE strength/ balance (hip abduction, flexion), gait mechanics, UE exercises    PT Home Exercise Plan  Access Code: [email or web address] Consulted and Agree with Plan of Care  Patient       Patient will  benefit from skilled therapeutic intervention in order to improve the following deficits and impairments:  Abnormal gait, Impaired sensation, Improper  body mechanics, Pain, Decreased coordination, Decreased mobility, Postural dysfunction, Decreased activity tolerance, Decreased endurance, Decreased range of motion, Decreased strength, Hypomobility, Impaired UE functional use, Difficulty walking, Impaired flexibility, Decreased balance  Visit Diagnosis: Antalgic gait  Decreased right shoulder range of motion  Muscle weakness (generalized)  Pain in both upper extremities  Other abnormalities of gait and mobility     Problem List There are no active problems to display for this patient.  Pura Spice, PT, DPT # [id number] Chinita Greenland, SPT 03/02/2019, 9:13 AM  [place] [facility] [address] [place], [place], [address] Phone: [phone]   Fax:  [phone]  Name: TYQUAN CARMICKLE MRN: [id number] Date of Birth: 01-02-1951

## 2019-03-04 ENCOUNTER — Other Ambulatory Visit: Payer: Self-pay

## 2019-03-04 ENCOUNTER — Encounter: Payer: Self-pay | Admitting: Physical Therapy

## 2019-03-04 ENCOUNTER — Ambulatory Visit: Payer: Medicare Other

## 2019-03-04 DIAGNOSIS — M79602 Pain in left arm: Secondary | ICD-10-CM

## 2019-03-04 DIAGNOSIS — M6281 Muscle weakness (generalized): Secondary | ICD-10-CM

## 2019-03-04 DIAGNOSIS — M79601 Pain in right arm: Secondary | ICD-10-CM

## 2019-03-04 DIAGNOSIS — R2689 Other abnormalities of gait and mobility: Secondary | ICD-10-CM | POA: Diagnosis not present

## 2019-03-04 DIAGNOSIS — M25611 Stiffness of right shoulder, not elsewhere classified: Secondary | ICD-10-CM

## 2019-03-04 NOTE — Therapy (Signed)
[facility] [address] [place], [place], [address] Phone: [phone]   Fax:  [phone]  Physical Therapy Treatment  Patient Details  Name: Lonnie Rodriguez MRN: [id number] Date of Birth: April 02, 1951 Referring Provider (PT): Deetta Perla, MD   Encounter Date: 03/04/2019  PT End of Session - 03/04/19 0807    Visit Number  15    Number of Visits  16    Date for PT Re-Evaluation  03/10/19    Authorization - Visit Number  6    Authorization - Number of Visits  10    PT Start Time  0803    PT Stop Time  5176    PT Time Calculation (min)  50 min    Equipment Utilized During Treatment  Gait belt    Activity Tolerance  Patient tolerated treatment well;No increased pain    Behavior During Therapy  [facility] for tasks assessed/performed       History reviewed. No pertinent past medical history.  History reviewed. No pertinent surgical history.  There were no vitals filed for this visit.  Subjective Assessment - 03/04/19 0804    Subjective  Pt reports no pain today, only stiffness in B LEs.  Pt reports performing HEP.    Pertinent History  Pt reports he had home health post-surgery and that his HEP includes heel raises with UE support, hip abduction with UE support, marching with UE support, and squats with UE support.    Limitations  Walking;House hold activities    Patient Stated Goals  ambulate without cane, be able to lift R arm without pain    Currently in Pain?  No/denies        Therapeutic Exercise: Nustep L5 x10 Staggered sit<>stand - 1x5 B Staggered sit <>stand on foam - 1x5 B Marching in // bars forward/ backward with 5# ankle weights - x4 Lateral walking in // bars with 5# ankle weights - x4 Standing hip abduction and extension with 5# ankle weights - 2x10 B Obstacle course in // bars stepping over 3.5" hurdles and 6" plinth with 4 alternating stair taps on 6" plinth (forward and lateral stepping) - x4 each with 5# ankle  weights MWM shoulder abduction - x5    PT Education - 03/04/19 0807    Education Details  Pt educated on hip abduction/ extension exercise technique    Person(s) Educated  Patient    Methods  Explanation;Demonstration    Comprehension  Verbalized understanding;Returned demonstration          PT Long Term Goals - 02/16/19 1232      PT LONG TERM GOAL #1   Title  Pt will score 55 on FOTO to indicate improved functional mobility    Baseline  FOTO score 45 01/13/2019    Time  8    Period  Weeks    Status  On-going    Target Date  03/10/19      PT LONG TERM GOAL #2   Title  Pt will increase cervical and R UE ROM to [facility] with 0/10 pain in order to be able to reach overhead to complete ADLs.    Baseline  Cervical AROM grossly limited (Extension: 20deg, L rotn: 20 deg, R rotn: 28 deg, L/R lat flex: 20 deg), UE AROM WNL except R shoulder flexion (120 deg), abduction (94 deg), and external rotation (76 deg) pain limited 01/13/2019    Time  8    Period  Weeks    Status  On-going    Target Date  03/10/19      PT LONG TERM GOAL #3   Title  Pt will ambulate with LRD for 5 minutes with improved upright posture and a gait speed of at least 1.22ms to improve community participation.    Baseline  Gait speed with no AD is 0.850m, with SPC 0.3m40m with forward head posture and rounded shoulders 01/08/2019; Gait speed with no AD is 1.06 m/s 02/11/2019    Time  8    Period  Weeks    Status  Partially Met    Target Date  03/10/19      PT LONG TERM GOAL #4   Title  Pt will perform 5xSTS without UE support in <12 sec to demonstrate increase LE power.    Baseline  5xSTS 16sec without UE support 01/08/2019; 5xSTS 13.6 sec without UE support 02/16/2019    Time  8    Period  Weeks    Status  Partially Met    Target Date  03/10/19      PT LONG TERM GOAL #5   Title  Pt will perform 25 heel raises on B LE to demonstrate increased plantarflexor endurance for safety with gait.    Baseline  Heel raise test R  LE 3 reps, L LE 8 reps, with significant WB through B LE 01/08/2019; Heel Raise Test: R LE 8 reps, L LE 10 reps, with moderate WB through B UE and decreased ROM B 02/11/2019    Time  8    Period  Weeks    Status  Not Met    Target Date  03/10/19      PT LONG TERM GOAL #6   Title  Pt will increase Berg score to at least 53/56 to demonstrate decrease fall risk    Baseline  Pt Berg score 48/56 01/19/2019; Pt Berg score 53/56 02/11/2019    Time  4    Period  Weeks    Status  Achieved            Plan - 03/04/19 0906    Clinical Impression Statement  Pt demonstrates increased LE strength/ balance, with remaining deficits in R hip flexion and L hip abduction.  Pt compensates with trunk momentum but it able to correct with cueing for tall posture and use of // bars for balance support.  Pt reports does not use cane at all anymore and is confident without it.  Pt has minimal pain with end range abduction of R UE.  Pt will benefit from further skilled therapy to address LE weakness, improve gait mechanics, and increase mobility of UE/ cervical region.    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    Clinical Decision Making  Moderate    Rehab Potential  Good    PT Frequency  2x / week    PT Duration  8 weeks    PT Treatment/Interventions  ADLs/Self Care Home Management;Biofeedback;Cryotherapy;Electrical Stimulation;Moist Heat;Traction;DME Instruction;Gait training;Stair training;Functional mobility training;Therapeutic activities;Therapeutic exercise;Balance training;Neuromuscular re-education;Patient/family education;Orthotic Fit/Training;Manual techniques;Passive range of motion;Energy conservation    PT Next Visit Plan  Progress LE strength/ balance with weights, UE exercises, cervical ROM/ MET     PT Home Exercise Plan  Access Code: [email or web address] Consulted and Agree with Plan of Care  Patient       Patient  will benefit from skilled therapeutic intervention in order to improve the following deficits and impairments:  Abnormal gait, Impaired sensation, Improper body mechanics, Pain, Decreased coordination, Decreased mobility, Postural dysfunction, Decreased activity tolerance, Decreased endurance, Decreased range of motion, Decreased strength, Hypomobility, Impaired UE functional use, Difficulty walking, Impaired flexibility, Decreased balance  Visit Diagnosis: Antalgic gait  Decreased right shoulder range of motion  Muscle weakness (generalized)  Pain in both upper extremities  Other abnormalities of gait and mobility     Problem List There are no active problems to display for this patient.   Chinita Greenland, SPT 03/04/2019, 9:12 AM  [facility] [address] [place], [place], [address] Phone: [phone]   Fax:  [phone]  Name: Lonnie Rodriguez MRN: [id number] Date of Birth: 07/19/1950

## 2019-03-09 ENCOUNTER — Encounter: Payer: Self-pay | Admitting: Physical Therapy

## 2019-03-09 ENCOUNTER — Ambulatory Visit: Payer: Medicare Other | Attending: Neurosurgery | Admitting: Physical Therapy

## 2019-03-09 ENCOUNTER — Other Ambulatory Visit: Payer: Self-pay

## 2019-03-09 DIAGNOSIS — M25611 Stiffness of right shoulder, not elsewhere classified: Secondary | ICD-10-CM

## 2019-03-09 DIAGNOSIS — M6281 Muscle weakness (generalized): Secondary | ICD-10-CM

## 2019-03-09 DIAGNOSIS — R2689 Other abnormalities of gait and mobility: Secondary | ICD-10-CM

## 2019-03-09 DIAGNOSIS — M79602 Pain in left arm: Secondary | ICD-10-CM | POA: Diagnosis present

## 2019-03-09 DIAGNOSIS — M79601 Pain in right arm: Secondary | ICD-10-CM | POA: Diagnosis present

## 2019-03-09 NOTE — Therapy (Signed)
[facility] [address] [place], [place], [address] Phone: [phone]   Fax:  [phone]  Physical Therapy Treatment  Patient Details  Name: Lonnie Rodriguez MRN: [id number] Date of Birth: Mar 09, 1951 Referring Provider (PT): Deetta Perla, MD   Encounter Date: 03/09/2019  PT End of Session - 03/09/19 0835    Visit Number  16    Number of Visits  16    Date for PT Re-Evaluation  03/10/19    Authorization - Visit Number  7    Authorization - Number of Visits  10    PT Start Time  0801    PT Stop Time  0846    PT Time Calculation (min)  45 min    Equipment Utilized During Treatment  Gait belt    Activity Tolerance  Patient tolerated treatment well;No increased pain    Behavior During Therapy  [facility] for tasks assessed/performed       History reviewed. No pertinent past medical history.  History reviewed. No pertinent surgical history.  There were no vitals filed for this visit.  Subjective Assessment - 03/09/19 0834    Subjective  Pt. reports no pain today and entered PT without use of SPC.  No falls reported.    Pertinent History  Pt reports he had home health post-surgery and that his HEP includes heel raises with UE support, hip abduction with UE support, marching with UE support, and squats with UE support.    Limitations  Walking;House hold activities    Patient Stated Goals  ambulate without cane, be able to lift R arm without pain    Currently in Pain?  No/denies         Therapeutic Exercise:  Nustep L5 x12 min. Standing step touches with min. To no UE assist.   Standing hip ex./ heel and toe raises 20x each (light UE assist on //-bars)- 2x10. Staggered sit<>stand - 1x5 B Standing UE ex. (GTB):  Scapular retraction/ shoulder extension/ bicep curls 30x. Lateral walking with 3" plinth step overs (light UE assist on //-bars) Seated marching/ LAQ (added neural glides on L/R 3x each). Walking in clinic/ hallway working  on consistent step pattern/ heel strike while maintaining upright posture/head position   CHECK GOALS/ RECERT next tx. session   PT Long Term Goals - 02/16/19 1232      PT LONG TERM GOAL #1   Title  Pt will score 55 on FOTO to indicate improved functional mobility    Baseline  FOTO score 45 01/13/2019    Time  8    Period  Weeks    Status  On-going    Target Date  03/10/19      PT LONG TERM GOAL #2   Title  Pt will increase cervical and R UE ROM to [facility] with 0/10 pain in order to be able to reach overhead to complete ADLs.    Baseline  Cervical AROM grossly limited (Extension: 20deg, L rotn: 20 deg, R rotn: 28 deg, L/R lat flex: 20 deg), UE AROM WNL except R shoulder flexion (120 deg), abduction (94 deg), and external rotation (76 deg) pain limited 01/13/2019    Time  8    Period  Weeks    Status  On-going    Target Date  03/10/19      PT LONG TERM GOAL #3   Title  Pt will ambulate with LRD for 5 minutes with improved upright posture and a gait speed of at  least 1.2m/s to improve community participation.    Baseline  Gait speed with no AD is 0.81m/s, with SPC 0.9m/s, with forward head posture and rounded shoulders 01/08/2019; Gait speed with no AD is 1.06 m/s 02/11/2019    Time  8    Period  Weeks    Status  Partially Met    Target Date  03/10/19      PT LONG TERM GOAL #4   Title  Pt will perform 5xSTS without UE support in <12 sec to demonstrate increase LE power.    Baseline  5xSTS 16sec without UE support 01/08/2019; 5xSTS 13.6 sec without UE support 02/16/2019    Time  8    Period  Weeks    Status  Partially Met    Target Date  03/10/19      PT LONG TERM GOAL #5   Title  Pt will perform 25 heel raises on B LE to demonstrate increased plantarflexor endurance for safety with gait.    Baseline  Heel raise test R LE 3 reps, L LE 8 reps, with significant WB through B LE 01/08/2019; Heel Raise Test: R LE 8 reps, L LE 10 reps, with moderate WB through B UE and decreased ROM B 02/11/2019     Time  8    Period  Weeks    Status  Not Met    Target Date  03/10/19      PT LONG TERM GOAL #6   Title  Pt will increase Berg score to at least 53/56 to demonstrate decrease fall risk    Baseline  Pt Berg score 48/56 01/19/2019; Pt Berg score 53/56 02/11/2019    Time  4    Period  Weeks    Status  Achieved         Plan - 03/09/19 0919    Clinical Impression Statement  Pt. worked really hard during tx. session/ progressing with standing ther.ex.  Several short seated rest breaks required.  Pt. able to complete lateral step overs with light UE assist and increase step pattern while maintaining upright posture.  No increase c/o pain during tx. sessoin and improvement in R shoulder flexion/ abduction.    Personal Factors and Comorbidities  Age    Examination-Activity Limitations  Locomotion Level;Reach Overhead    Examination-Participation Restrictions  Community Activity;Yard Work    Stability/Clinical Decision Making  Evolving/Moderate complexity    Clinical Decision Making  Moderate    Rehab Potential  Good    PT Frequency  2x / week    PT Duration  8 weeks    PT Treatment/Interventions  ADLs/Self Care Home Management;Biofeedback;Cryotherapy;Electrical Stimulation;Moist Heat;Traction;DME Instruction;Gait training;Stair training;Functional mobility training;Therapeutic activities;Therapeutic exercise;Balance training;Neuromuscular re-education;Patient/family education;Orthotic Fit/Training;Manual techniques;Passive range of motion;Energy conservation    PT Next Visit Plan  Progress LE strength/ balance with weights, UE exercises    PT Home Exercise Plan  Access Code: [email or web address]    Consulted and Agree with Plan of Care  Patient       Patient will benefit from skilled therapeutic intervention in order to improve the following deficits and impairments:  Abnormal gait, Impaired sensation, Improper body mechanics, Pain, Decreased coordination, Decreased mobility, Postural dysfunction,  Decreased activity tolerance, Decreased endurance, Decreased range of motion, Decreased strength, Hypomobility, Impaired UE functional use, Difficulty walking, Impaired flexibility, Decreased balance  Visit Diagnosis: Antalgic gait  Decreased right shoulder range of motion  Muscle weakness (generalized)  Pain in both upper extremities  Other abnormalities of gait and mobility       Problem List There are no active problems to display for this patient.  Michael C Sherk, PT, DPT # [id number] 03/09/2019, 9:22 AM  [place] [facility] [address] [place], , [address] Phone: [phone]   Fax:  [phone]  Name: Lonnie Rodriguez MRN: [id number] Date of Birth: 04/05/1951   

## 2019-03-11 ENCOUNTER — Encounter: Payer: Self-pay | Admitting: Physical Therapy

## 2019-03-11 ENCOUNTER — Ambulatory Visit: Payer: Medicare Other | Admitting: Physical Therapy

## 2019-03-11 ENCOUNTER — Other Ambulatory Visit: Payer: Self-pay

## 2019-03-11 DIAGNOSIS — R2689 Other abnormalities of gait and mobility: Secondary | ICD-10-CM | POA: Diagnosis not present

## 2019-03-11 DIAGNOSIS — M25611 Stiffness of right shoulder, not elsewhere classified: Secondary | ICD-10-CM

## 2019-03-11 DIAGNOSIS — M79601 Pain in right arm: Secondary | ICD-10-CM

## 2019-03-11 DIAGNOSIS — M6281 Muscle weakness (generalized): Secondary | ICD-10-CM

## 2019-03-11 NOTE — Therapy (Signed)
[facility] [address] [place], [place], [address] Phone: [phone]   Fax:  [phone]  Physical Therapy Treatment  Patient Details  Name: Lonnie Rodriguez MRN: [id number] Date of Birth: 10-26-1950 Referring Provider (PT): Deetta Perla, MD   Encounter Date: 03/11/2019  PT End of Session - 03/11/19 0808    Visit Number  17    Number of Visits  25    Date for PT Re-Evaluation  04/08/19    Authorization - Visit Number  1    Authorization - Number of Visits  10    PT Start Time  8315    PT Stop Time  1761    PT Time Calculation (min)  61 min    Equipment Utilized During Treatment  Gait belt    Activity Tolerance  Patient tolerated treatment well;No increased pain    Behavior During Therapy  [facility] for tasks assessed/performed       History reviewed. No pertinent past medical history.  History reviewed. No pertinent surgical history.  There were no vitals filed for this visit.  Subjective Assessment - 03/11/19 0854    Subjective  Pt. entered PT with no c/o pain.  Pt. reports doing well and states he is happy with progress being made.  Pt. has several more weeks of PT scheduled to reach goals    Pertinent History  Pt reports he had home health post-surgery and that his HEP includes heel raises with UE support, hip abduction with UE support, marching with UE support, and squats with UE support.    Limitations  Walking;House hold activities    Patient Stated Goals  ambulate without cane, be able to lift R arm without pain    Currently in Pain?  No/denies         [facility] PT Assessment - 03/11/19 0001      Assessment   Medical Diagnosis  S/P cervical spinal fusion    Referring Provider (PT)  Deetta Perla, MD    Onset Date/Surgical Date  10/15/18    Next MD Visit  March 2021      [place] residence      Prior Function   Level of Independence  Independent         Therapeutic  Exercise:  Nustep L5 x12 min. Reviewed goals (FOTO/ 5xSTS/ heel raises) - see goal section Standing hip ex./ heel and toe raises 20x each (light UE assist on //-bars)- 20x. Standing hamstring stretches at 6" step 3x each with static holds Standing step touches with min. To no UE assist.   Standing UE ex. (GTB):  Scapular retraction/ shoulder extension 30x. Walking in clinic/ hallway working on consistent step pattern/ heel strike while maintaining upright posture/head position  Discussed walking/ HEP   PT Long Term Goals - 03/11/19 0808      PT LONG TERM GOAL #1   Title  Pt will score 55 on FOTO to indicate improved functional mobility    Baseline  FOTO score 45 01/13/2019.  11/5: 61 (goal met    Time  4    Period  Weeks    Status  Achieved    Target Date  03/11/19      PT LONG TERM GOAL #2   Title  Pt will increase cervical and R UE ROM to [facility] with 0/10 pain in order to be able to reach overhead to complete ADLs.    Baseline  Cervical AROM  grossly limited (Extension: 39 deg, L rotn: 44 deg, R rotn: 44 deg, L/R lat flex: 28 deg), UE AROM WNL except R shoulder flexion (146 deg), abduction (128 deg), and external rotation (75 deg.)- no pain    Time  4    Period  Weeks    Status  Achieved    Target Date  03/11/19      PT LONG TERM GOAL #3   Title  Pt will ambulate with LRD for 5 minutes with improved upright posture and a gait speed of at least 1.54ms to improve community participation.    Baseline  Gait speed with no AD is 0.868m, with SPC 0.72m70m with forward head posture and rounded shoulders 01/08/2019; Gait speed with no AD is 1.06 m/s 02/11/2019    Time  8    Period  Weeks    Status  Partially Met    Target Date  04/08/19      PT LONG TERM GOAL #4   Title  Pt will perform 5xSTS without UE support in <12 sec to demonstrate increase LE power.    Baseline  5xSTS 16sec without UE support 01/08/2019; 5xSTS 13.6 sec without UE support 02/16/2019.  11/5: 13.5 seconds (no UE assist/  good posture)    Time  4    Period  Weeks    Status  Partially Met    Target Date  04/08/19      PT LONG TERM GOAL #5   Title  Pt will perform 25 heel raises on B LE to demonstrate increased plantarflexor endurance for safety with gait.    Baseline  Heel raise test R LE 3 reps, L LE 8 reps, with significant WB through B LE 01/08/2019; Heel Raise Test: R LE 8 reps, L LE 10 reps, with moderate WB through B UE and decreased ROM B 02/11/2019    Time  4    Period  Weeks    Status  Partially Met    Target Date  04/08/19      PT LONG TERM GOAL #6   Title  Pt will increase Berg score to at least 53/56 to demonstrate decrease fall risk    Baseline  Pt Berg score 48/56 01/19/2019; Pt Berg score 53/56 02/11/2019    Time  4    Period  Weeks    Status  Achieved    Target Date  02/11/19          Plan - 03/11/19 0842    Clinical Impression Statement  Pt. continues to show consistent progress towards goals (see updated goals).  FOTO: 61.  Pt. reports no pain in shoulder/neck during goal reassessment and progression of UE ex.  Pt. very motivated and working hard towards all goals.  Pt. will benefit from continued LE strengthening ex./ dynamic tasks to promote greater independence/ safety at home.    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    Clinical Decision Making  Moderate    Rehab Potential  Good    PT Frequency  2x / week    PT Duration  4 weeks    PT Treatment/Interventions  ADLs/Self Care Home Management;Biofeedback;Cryotherapy;Electrical Stimulation;Moist Heat;Traction;DME Instruction;Gait training;Stair training;Functional mobility training;Therapeutic activities;Therapeutic exercise;Balance training;Neuromuscular re-education;Patient/family education;Orthotic Fit/Training;Manual  techniques;Passive range of motion;Energy conservation    PT Next Visit Plan  Progress LE strength/ balance with weights,  UE exercises    PT Home Exercise Plan  Access Code: [email or web address]    Consulted and Agree with Plan of Care  Patient       Patient will benefit from skilled therapeutic intervention in order to improve the following deficits and impairments:  Abnormal gait, Impaired sensation, Improper body mechanics, Pain, Decreased coordination, Decreased mobility, Postural dysfunction, Decreased activity tolerance, Decreased endurance, Decreased range of motion, Decreased strength, Hypomobility, Impaired UE functional use, Difficulty walking, Impaired flexibility, Decreased balance  Visit Diagnosis: Antalgic gait  Decreased right shoulder range of motion  Muscle weakness (generalized)  Pain in both upper extremities  Other abnormalities of gait and mobility     Problem List There are no active problems to display for this patient.  Pura Spice, PT, DPT # [phone] 03/11/2019, 8:58 AM  [facility] [address] [place], [place], [address] Phone: [phone]   Fax:  [phone]  Name: DAWIT TANKARD MRN: [id number] Date of Birth: 1951/05/05

## 2019-03-16 ENCOUNTER — Encounter: Payer: Self-pay | Admitting: Physical Therapy

## 2019-03-16 ENCOUNTER — Other Ambulatory Visit: Payer: Self-pay

## 2019-03-16 ENCOUNTER — Ambulatory Visit: Payer: Medicare Other | Admitting: Physical Therapy

## 2019-03-16 DIAGNOSIS — R2689 Other abnormalities of gait and mobility: Secondary | ICD-10-CM

## 2019-03-16 DIAGNOSIS — M25611 Stiffness of right shoulder, not elsewhere classified: Secondary | ICD-10-CM

## 2019-03-16 DIAGNOSIS — M79601 Pain in right arm: Secondary | ICD-10-CM

## 2019-03-16 DIAGNOSIS — M6281 Muscle weakness (generalized): Secondary | ICD-10-CM

## 2019-03-16 NOTE — Therapy (Signed)
[facility] [address] [place], [place], [address] Phone: [phone]   Fax:  [phone]  Physical Therapy Treatment  Patient Details  Name: Lonnie Rodriguez MRN: [id number] Date of Birth: [birth year] Referring Provider (PT): Deetta Perla, MD   Encounter Date: 03/16/2019  PT End of Session - 03/16/19 0811    Visit Number  18    Number of Visits  25    Date for PT Re-Evaluation  04/08/19    Authorization - Visit Number  2    Authorization - Number of Visits  10    PT Start Time  0801    PT Stop Time  4765    PT Time Calculation (min)  50 min    Equipment Utilized During Treatment  Gait belt    Activity Tolerance  Patient tolerated treatment well;No increased pain    Behavior During Therapy  [facility] for tasks assessed/performed       History reviewed. No pertinent past medical history.  History reviewed. No pertinent surgical history.  There were no vitals filed for this visit.  Subjective Assessment - 03/16/19 0810    Subjective  Pt. states he is doing well.  No c/o pain.    Pertinent History  Pt reports he had home health post-surgery and that his HEP includes heel raises with UE support, hip abduction with UE support, marching with UE support, and squats with UE support.    Limitations  Walking;House hold activities    Patient Stated Goals  ambulate without cane, be able to lift R arm without pain    Currently in Pain?  No/denies          Therapeutic Exercise:  Nustep L5 x10mn. B UE/LE Seated: 4# chest press 20x.  Standing: 4# bicep curls/ scaption  20x2 Standing UE ex. (GTB): Scapular retraction/ triceps extension 20x2. Seated (4#): marching/ heel and toe raises 20x each.  Walking in //-bars forward/ backwards/ lateral 4x (light to no UE assist on //-bars). Standing step touches (1st/ 2nd step) with min. To no UE assist/ ascending and descending stairs 4x.  Walking in clinic/ hallwayworking on consistent step  pattern/ heel strike while maintaining upright posture/head position.  Working on increasing cadence.      PT Long Term Goals - 03/11/19 0808      PT LONG TERM GOAL #1   Title  Pt will score 55 on FOTO to indicate improved functional mobility    Baseline  FOTO score 45 01/13/2019.  11/5: 61 (goal met    Time  4    Period  Weeks    Status  Achieved    Target Date  03/11/19      PT LONG TERM GOAL #2   Title  Pt will increase cervical and R UE [facility] to [facility] in order to be able to reach overhead to complete ADLs.    Baseline  Cervical AROM grossly limited (Extension: 39 deg, L rotn: 44 deg, R rotn: 44 deg, L/R lat flex: 28 deg), UE AROM WNL except R shoulder flexion (146 deg), abduction (128 deg), and external rotation (75 deg.)- no pain    Time  4    Period  Weeks    Status  Achieved    Target Date  03/11/19      PT LONG TERM GOAL #3   Title  Pt will ambulate with LRD for 5 minutes with improved upright posture and a gait speed of at least  1.52ms to improve community participation.    Baseline  Gait speed with no AD is 0.8458m, with SPC 0.58m34m with forward head posture and rounded shoulders 01/08/2019; Gait speed with no AD is 1.06 m/s 02/11/2019    Time  8    Period  Weeks    Status  Partially Met    Target Date  04/08/19      PT LONG TERM GOAL #4   Title  Pt will perform 5xSTS without UE support in <12 sec to demonstrate increase LE power.    Baseline  5xSTS 16sec without UE support 01/08/2019; 5xSTS 13.6 sec without UE support 02/16/2019.  11/5: 13.5 seconds (no UE assist/ good posture)    Time  4    Period  Weeks    Status  Partially Met    Target Date  04/08/19      PT LONG TERM GOAL #5   Title  Pt will perform 25 heel raises on B LE to demonstrate increased plantarflexor endurance for safety with gait.    Baseline  Heel raise test R LE 3 reps, L LE 8 reps, with significant WB through B LE 01/08/2019; Heel Raise Test: R LE 8 reps, L LE 10 reps, with moderate WB through  B UE and decreased ROM B 02/11/2019    Time  4    Period  Weeks    Status  Partially Met    Target Date  04/08/19      PT LONG TERM GOAL #6   Title  Pt will increase Berg score to at least 53/56 to demonstrate decrease fall risk    Baseline  Pt Berg score 48/56 01/19/2019; Pt Berg score 53/56 02/11/2019    Time  4    Period  Weeks    Status  Achieved    Target Date  02/11/19            Plan - 03/16/19 0811    Clinical Impression Statement  Pt. requires limited rest breaks with progression of UE/LE ex. program.  Pt. able to complete 2 sets of UE ex. and push self to complete with proper technique/ mirror feedback.  Pt. ambulates with increase cadence/ step pattern with consistent heel strike/toe off.  No LOB during tx. session/ ther.ex.  Pt. remains highly motivated to progress towards all functional goals.    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    Clinical Decision Making  Moderate    Rehab Potential  Good    PT Frequency  2x / week    PT Duration  4 weeks    PT Treatment/Interventions  ADLs/Self Care Home Management;Biofeedback;Cryotherapy;Electrical Stimulation;Moist Heat;Traction;DME Instruction;Gait training;Stair training;Functional mobility training;Therapeutic activities;Therapeutic exercise;Balance training;Neuromuscular re-education;Patient/family education;Orthotic Fit/Training;Manual techniques;Passive range of motion;Energy conservation    PT Next Visit Plan  Progress LE strength/ balance with weights, UE exercises    PT Home Exercise Plan  Access Code: [email or web address] Consulted and Agree with Plan of Care  Patient       Patient will benefit from skilled therapeutic intervention in order to improve the following deficits and impairments:  Abnormal gait, Impaired  sensation, Improper body mechanics, Pain, Decreased coordination, Decreased mobility, Postural dysfunction, Decreased activity tolerance, Decreased endurance, Decreased range of motion, Decreased strength, Hypomobility, Impaired UE functional use, Difficulty walking, Impaired flexibility, Decreased balance  Visit Diagnosis: Antalgic  gait  Decreased right shoulder range of motion  Muscle weakness (generalized)  Pain in both upper extremities  Other abnormalities of gait and mobility     Problem List There are no active problems to display for this patient.  Pura Spice, PT, DPT # [id number] 03/16/2019, 11:48 AM  [facility] [facility] [address] [place], [place], [address] Phone: [phone]   Fax:  [phone]  Name: ALTA SHOBER MRN: [id number] Date of Birth: [birth year]

## 2019-03-18 ENCOUNTER — Other Ambulatory Visit: Payer: Self-pay

## 2019-03-18 ENCOUNTER — Ambulatory Visit: Payer: Medicare Other | Admitting: Physical Therapy

## 2019-03-18 ENCOUNTER — Encounter: Payer: Self-pay | Admitting: Physical Therapy

## 2019-03-18 DIAGNOSIS — M79601 Pain in right arm: Secondary | ICD-10-CM

## 2019-03-18 DIAGNOSIS — M79602 Pain in left arm: Secondary | ICD-10-CM

## 2019-03-18 DIAGNOSIS — M25611 Stiffness of right shoulder, not elsewhere classified: Secondary | ICD-10-CM

## 2019-03-18 DIAGNOSIS — M6281 Muscle weakness (generalized): Secondary | ICD-10-CM

## 2019-03-18 DIAGNOSIS — R2689 Other abnormalities of gait and mobility: Secondary | ICD-10-CM | POA: Diagnosis not present

## 2019-03-18 NOTE — Therapy (Signed)
[place] [facility] [address]. [place], [place], [address] Phone: [phone]   Fax:  [phone]  Physical Therapy Treatment  Patient Details  Name: Lonnie Rodriguez MRN: [id number] Date of Birth: [birth year] Referring Provider (PT): Deetta Perla, MD   Encounter Date: 03/18/2019  PT End of Session - 03/20/19 1148    Visit Number  19    Number of Visits  25    Date for PT Re-Evaluation  04/08/19    Authorization - Visit Number  3    Authorization - Number of Visits  10    PT Start Time  7026    PT Stop Time  1507    PT Time Calculation (min)  43 min    Equipment Utilized During Treatment  Gait belt    Activity Tolerance  Patient tolerated treatment well;No increased pain    Behavior During Therapy  [facility] for tasks assessed/performed       History reviewed. No pertinent past medical history.  History reviewed. No pertinent surgical history.  There were no vitals filed for this visit.  Subjective Assessment - 03/20/19 1145    Subjective  Pt. saw MD this morning in preparation for use of CPAP machine.  No c/o pain.  Pt. entered PT with no assistive device.    Pertinent History  Pt reports he had home health post-surgery and that his HEP includes heel raises with UE support, hip abduction with UE support, marching with UE support, and squats with UE support.    Limitations  Walking;House hold activities    Patient Stated Goals  ambulate without cane, be able to lift R arm without pain    Currently in Pain?  No/denies         Therapeutic Exercise:  Nustep L6 x 25mn. B UE/LE Standing L/R hip flexion with added R/L shoulder flexion (GTB)- alternating 10x2 each.  1 UE on //-bars for balance/ posture. Standing UE ex. (GTB): Scapular retraction/ triceps extension/ diagonals 20x2. Seated (4#): marching/ heel and toe raises 20x each.  Walking in //-bars forward/ backwards/ lateral 4x (light to no UE assist on //-bars). Standing step  touches (1st/ 2nd step) with min. To no UE assist/ ascending and descending stairs 4x.  Walking in clinic/ hallwayworking on consistent step pattern/ heel strike while maintaining upright posture/head position.  Working on increasing cadence.     PT Long Term Goals - 03/11/19 0808      PT LONG TERM GOAL #1   Title  Pt will score 55 on FOTO to indicate improved functional mobility    Baseline  FOTO score 45 01/13/2019.  11/5: 61 (goal met    Time  4    Period  Weeks    Status  Achieved    Target Date  03/11/19      PT LONG TERM GOAL #2   Title  Pt will increase cervical and R UE ROM to [facility] pain in order to be able to reach overhead to complete ADLs.    Baseline  Cervical AROM grossly limited (Extension: 39 deg, L rotn: 44 deg, R rotn: 44 deg, L/R lat flex: 28 deg), UE AROM WNL except R shoulder flexion (146 deg), abduction (128 deg), and external rotation (75 deg.)- no pain    Time  4    Period  Weeks    Status  Achieved    Target Date  03/11/19      PT LONG TERM GOAL #3  Title  Pt will ambulate with LRD for 5 minutes with improved upright posture and a gait speed of at least 1.175ms to improve community participation.    Baseline  Gait speed with no AD is 0.847m, with SPC 0.75m42m with forward head posture and rounded shoulders 01/08/2019; Gait speed with no AD is 1.06 m/s 02/11/2019    Time  8    Period  Weeks    Status  Partially Met    Target Date  04/08/19      PT LONG TERM GOAL #4   Title  Pt will perform 5xSTS without UE support in <12 sec to demonstrate increase LE power.    Baseline  5xSTS 16sec without UE support 01/08/2019; 5xSTS 13.6 sec without UE support 02/16/2019.  11/5: 13.5 seconds (no UE assist/ good posture)    Time  4    Period  Weeks    Status  Partially Met    Target Date  04/08/19      PT LONG TERM GOAL #5   Title  Pt will perform 25 heel raises on B LE to demonstrate increased plantarflexor endurance for safety with gait.    Baseline  Heel raise  test R LE 3 reps, L LE 8 reps, with significant WB through B LE 01/08/2019; Heel Raise Test: R LE 8 reps, L LE 10 reps, with moderate WB through B UE and decreased ROM B 02/11/2019    Time  4    Period  Weeks    Status  Partially Met    Target Date  04/08/19      PT LONG TERM GOAL #6   Title  Pt will increase Berg score to at least 53/56 to demonstrate decrease fall risk    Baseline  Pt Berg score 48/56 01/19/2019; Pt Berg score 53/56 02/11/2019    Time  4    Period  Weeks    Status  Achieved    Target Date  02/11/19            Plan - 03/20/19 1149    Clinical Impression Statement  Pt. continues to work hard and progress with UE/LE ther.ex. program.  No LOB and consistent step pattern/ heel strike noted while walking around PT clinic.  No change to HEP    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    Clinical Decision Making  Moderate    Rehab Potential  Good    PT Frequency  2x / week    PT Duration  4 weeks    PT Treatment/Interventions  ADLs/Self Care Home Management;Biofeedback;Cryotherapy;Electrical Stimulation;Moist Heat;Traction;DME Instruction;Gait training;Stair training;Functional mobility training;Therapeutic activities;Therapeutic exercise;Balance training;Neuromuscular re-education;Patient/family education;Orthotic Fit/Training;Manual techniques;Passive range of motion;Energy conservation    PT Next Visit Plan  Progress LE strength/ balance with weights, UE exercises    PT Home Exercise Plan  Access Code: [email or web address] Consulted and Agree with Plan of Care  Patient       Patient will benefit from skilled therapeutic intervention in order to improve the following deficits and impairments:  Abnormal gait, Impaired sensation, Improper body mechanics, Pain, Decreased  coordination, Decreased mobility, Postural dysfunction, Decreased activity tolerance, Decreased endurance, Decreased range of motion, Decreased strength, Hypomobility, Impaired UE functional use, Difficulty walking, Impaired flexibility, Decreased balance  Visit Diagnosis: Antalgic gait  Decreased right shoulder range of motion  Muscle  weakness (generalized)  Pain in both upper extremities  Other abnormalities of gait and mobility     Problem List There are no active problems to display for this patient.  Pura Spice, PT, DPT # [phone] 03/20/2019, 12:00 PM  [place] [facility] [address] [place], [place], [address] Phone: [phone]   Fax:  [phone]  Name: Lonnie Rodriguez MRN: [id number] Date of Birth: 10/22/1950

## 2019-03-23 ENCOUNTER — Ambulatory Visit: Payer: Medicare Other | Admitting: Physical Therapy

## 2019-03-23 ENCOUNTER — Encounter: Payer: Self-pay | Admitting: Physical Therapy

## 2019-03-23 ENCOUNTER — Other Ambulatory Visit: Payer: Self-pay

## 2019-03-23 DIAGNOSIS — M6281 Muscle weakness (generalized): Secondary | ICD-10-CM

## 2019-03-23 DIAGNOSIS — M79601 Pain in right arm: Secondary | ICD-10-CM

## 2019-03-23 DIAGNOSIS — M25611 Stiffness of right shoulder, not elsewhere classified: Secondary | ICD-10-CM

## 2019-03-23 DIAGNOSIS — R2689 Other abnormalities of gait and mobility: Secondary | ICD-10-CM

## 2019-03-23 DIAGNOSIS — M79602 Pain in left arm: Secondary | ICD-10-CM

## 2019-03-23 NOTE — Therapy (Signed)
[place] [facility] [address]. [place], [place], [address] Phone: [phone]   Fax:  [phone]  Physical Therapy Treatment  Patient Details  Name: Lonnie Rodriguez MRN: [id number] Date of Birth: 09-04-1950 Referring Provider (PT): Deetta Perla, MD   Encounter Date: 03/23/2019  PT End of Session - 03/23/19 0808    Visit Number  20    Number of Visits  25    Date for PT Re-Evaluation  04/08/19    Authorization - Visit Number  4    Authorization - Number of Visits  10    PT Start Time  0801    PT Stop Time  0848    PT Time Calculation (min)  47 min    Equipment Utilized During Treatment  --    Activity Tolerance  Patient tolerated treatment well    Behavior During Therapy  [facility] for tasks assessed/performed       History reviewed. No pertinent past medical history.  History reviewed. No pertinent surgical history.  There were no vitals filed for this visit.  Subjective Assessment - 03/23/19 0807    Subjective  Pt. reports no new complaints.  No c/o pain at this time.    Pertinent History  Pt reports he had home health post-surgery and that his HEP includes heel raises with UE support, hip abduction with UE support, marching with UE support, and squats with UE support.    Limitations  Walking;House hold activities    Patient Stated Goals  ambulate without cane, be able to lift R arm without pain    Currently in Pain?  No/denies          Therapeutic Exercise:  Nustep L6 x 51mn.B UE/LE Walking around PT clinic/ high marching and lateral walking in //-bars 6x each (no UE assist).   Nautilus: seated lat. Pull downs 40#/ tricep extension 30#/ B sh. Adduction with handles 30#/ scap. Retraction 40# - 30x each.  Walking on agility ladder with cuing for BOS/ step length/ upright posture.   Standing step touches(1st/ 2nd step)with min. To no UE assist/ ascending and descending stairs 6x (light UE assist on handrails).  Walking in  clinic/ outsideworking on consistent step pattern/ heel strike while maintaining upright posture/head position. Working on increasing cadence with use of assistive device.       PT Long Term Goals - 03/11/19 0808      PT LONG TERM GOAL #1   Title  Pt will score 55 on FOTO to indicate improved functional mobility    Baseline  FOTO score 45 01/13/2019.  11/5: 61 (goal met    Time  4    Period  Weeks    Status  Achieved    Target Date  03/11/19      PT LONG TERM GOAL #2   Title  Pt will increase cervical and R UE ROM to [facility]10 pain in order to be able to reach overhead to complete ADLs.    Baseline  Cervical AROM grossly limited (Extension: 39 deg, L rotn: 44 deg, R rotn: 44 deg, L/R lat flex: 28 deg), UE AROM WNL except R shoulder flexion (146 deg), abduction (128 deg), and external rotation (75 deg.)- no pain    Time  4    Period  Weeks    Status  Achieved    Target Date  03/11/19      PT LONG TERM GOAL #3   Title  Pt will ambulate with  LRD for 5 minutes with improved upright posture and a gait speed of at least 1.52ms to improve community participation.    Baseline  Gait speed with no AD is 0.811m, with SPC 0.58m65m with forward head posture and rounded shoulders 01/08/2019; Gait speed with no AD is 1.06 m/s 02/11/2019    Time  8    Period  Weeks    Status  Partially Met    Target Date  04/08/19      PT LONG TERM GOAL #4   Title  Pt will perform 5xSTS without UE support in <12 sec to demonstrate increase LE power.    Baseline  5xSTS 16sec without UE support 01/08/2019; 5xSTS 13.6 sec without UE support 02/16/2019.  11/5: 13.5 seconds (no UE assist/ good posture)    Time  4    Period  Weeks    Status  Partially Met    Target Date  04/08/19      PT LONG TERM GOAL #5   Title  Pt will perform 25 heel raises on B LE to demonstrate increased plantarflexor endurance for safety with gait.    Baseline  Heel raise test R LE 3 reps, L LE 8 reps, with significant WB through B LE  01/08/2019; Heel Raise Test: R LE 8 reps, L LE 10 reps, with moderate WB through B UE and decreased ROM B 02/11/2019    Time  4    Period  Weeks    Status  Partially Met    Target Date  04/08/19      PT LONG TERM GOAL #6   Title  Pt will increase Berg score to at least 53/56 to demonstrate decrease fall risk    Baseline  Pt Berg score 48/56 01/19/2019; Pt Berg score 53/56 02/11/2019    Time  4    Period  Weeks    Status  Achieved    Target Date  02/11/19            Plan - 03/23/19 0808    Clinical Impression Statement  Pt. requires a couple short seated rest breaks during standing ther.ex./ walking tasks in clinic.  Pt. demonstrates good B UE technique/ posture during Nautilus resisted ex.  Pt. ambulates on agility ladder with consistent 15 inch recip. step pattern/ heel strike and toe off.  No LOB and pt. more confident with step pattern/ dynamic balance tasks during PT session.  Less UE assist required with recip. step pattern.  Pt. will continue to increase walking endurance and complete daily HEP.    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    Clinical Decision Making  Moderate    Rehab Potential  Good    PT Frequency  2x / week    PT Duration  4 weeks    PT Treatment/Interventions  ADLs/Self Care Home Management;Biofeedback;Cryotherapy;Electrical Stimulation;Moist Heat;Traction;DME Instruction;Gait training;Stair training;Functional mobility training;Therapeutic activities;Therapeutic exercise;Balance training;Neuromuscular re-education;Patient/family education;Orthotic Fit/Training;Manual techniques;Passive range of motion;Energy conservation    PT Next Visit Plan  Progress LE strength/ balance with weights, UE exercises    PT Home Exercise Plan  Access Code: [email or web address]  Consulted and Agree with Plan of Care  Patient       Patient will benefit from skilled therapeutic intervention in order to improve the following deficits and impairments:  Abnormal gait, Impaired sensation, Improper  body mechanics, Pain, Decreased coordination, Decreased mobility, Postural dysfunction, Decreased activity tolerance, Decreased endurance, Decreased range of motion, Decreased strength, Hypomobility, Impaired UE functional use, Difficulty walking, Impaired flexibility, Decreased balance  Visit Diagnosis: Antalgic gait  Decreased right shoulder range of motion  Muscle weakness (generalized)  Pain in both upper extremities  Other abnormalities of gait and mobility     Problem List There are no active problems to display for this patient.  Pura Spice, PT, DPT # [phone] 03/23/2019, 9:32 AM  [place] [facility] [address] [place], [place], [address] Phone: [phone]   Fax:  [phone]  Name: Lonnie Rodriguez MRN: [id number] Date of Birth: 1950-06-22

## 2019-03-25 ENCOUNTER — Encounter: Payer: Self-pay | Admitting: Physical Therapy

## 2019-03-25 ENCOUNTER — Ambulatory Visit: Payer: Medicare Other | Admitting: Physical Therapy

## 2019-03-25 ENCOUNTER — Other Ambulatory Visit: Payer: Self-pay

## 2019-03-25 DIAGNOSIS — R2689 Other abnormalities of gait and mobility: Secondary | ICD-10-CM

## 2019-03-25 DIAGNOSIS — M79602 Pain in left arm: Secondary | ICD-10-CM

## 2019-03-25 DIAGNOSIS — M79601 Pain in right arm: Secondary | ICD-10-CM

## 2019-03-25 DIAGNOSIS — M6281 Muscle weakness (generalized): Secondary | ICD-10-CM

## 2019-03-25 DIAGNOSIS — M25611 Stiffness of right shoulder, not elsewhere classified: Secondary | ICD-10-CM

## 2019-03-25 NOTE — Therapy (Signed)
[facility] [address] [place], [place], [address] Phone: [phone]   Fax:  [phone]  Physical Therapy Treatment  Patient Details  Name: Lonnie Rodriguez MRN: [id number] Date of Birth: [birth year] Referring Provider (PT): Deetta Perla, MD   Encounter Date: 03/25/2019  PT End of Session - 03/25/19 0900    Visit Number  21    Number of Visits  25    Date for PT Re-Evaluation  04/08/19    Authorization - Visit Number  5    Authorization - Number of Visits  10    PT Start Time  0802    PT Stop Time  5400    PT Time Calculation (min)  49 min    Activity Tolerance  Patient tolerated treatment well    Behavior During Therapy  [facility] for tasks assessed/performed       History reviewed. No pertinent past medical history.  History reviewed. No pertinent surgical history.  There were no vitals filed for this visit.  Subjective Assessment - 03/25/19 0858    Subjective  Pt. reports no new complaints.  Pt. states he is getting better and has been staying active without UE assist.    Pertinent History  Pt reports he had home health post-surgery and that his HEP includes heel raises with UE support, hip abduction with UE support, marching with UE support, and squats with UE support.    Limitations  Walking;House hold activities    Patient Stated Goals  ambulate without cane, be able to lift R arm without pain    Currently in Pain?  No/denies        Therapeutic Exercise:  Nustep L6x 31mn.B UE/LE Walking in //-bars: Airex step ups/overs for ankle control with light to no UE assist 5x.  Nautilus: walk outs (all planes) with 70# 6x each (no UE assist)- cuing to increase heel/ toe strike. Walking around PT clinic/ high marching and lateral walking in //-bars 6x each (no UE assist).   Nautilus: seated lat. Pull downs 40#/ tricep extension 30#/ B sh. Adduction with handles 30#/ scap. Retraction 40# - 20x2 each.  Walking on agility  ladder with cuing for BOS/ step length/ upright posture.   Ascending and descending stairs 6x (light UE assist on handrails).     PT Long Term Goals - 03/11/19 0808      PT LONG TERM GOAL #1   Title  Pt will score 55 on FOTO to indicate improved functional mobility    Baseline  FOTO score 45 01/13/2019.  11/5: 61 (goal met    Time  4    Period  Weeks    Status  Achieved    Target Date  03/11/19      PT LONG TERM GOAL #2   Title  Pt will increase cervical and R UE ROM to [facility]10 pain in order to be able to reach overhead to complete ADLs.    Baseline  Cervical AROM grossly limited (Extension: 39 deg, L rotn: 44 deg, R rotn: 44 deg, L/R lat flex: 28 deg), UE AROM WNL except R shoulder flexion (146 deg), abduction (128 deg), and external rotation (75 deg.)- no pain    Time  4    Period  Weeks    Status  Achieved    Target Date  03/11/19      PT LONG TERM GOAL #3   Title  Pt will ambulate with LRD for 5 minutes with improved  upright posture and a gait speed of at least 1.67ms to improve community participation.    Baseline  Gait speed with no AD is 0.8328m, with SPC 0.28m41m with forward head posture and rounded shoulders 01/08/2019; Gait speed with no AD is 1.06 m/s 02/11/2019    Time  8    Period  Weeks    Status  Partially Met    Target Date  04/08/19      PT LONG TERM GOAL #4   Title  Pt will perform 5xSTS without UE support in <12 sec to demonstrate increase LE power.    Baseline  5xSTS 16sec without UE support 01/08/2019; 5xSTS 13.6 sec without UE support 02/16/2019.  11/5: 13.5 seconds (no UE assist/ good posture)    Time  4    Period  Weeks    Status  Partially Met    Target Date  04/08/19      PT LONG TERM GOAL #5   Title  Pt will perform 25 heel raises on B LE to demonstrate increased plantarflexor endurance for safety with gait.    Baseline  Heel raise test R LE 3 reps, L LE 8 reps, with significant WB through B LE 01/08/2019; Heel Raise Test: R LE 8 reps, L LE 10 reps,  with moderate WB through B UE and decreased ROM B 02/11/2019    Time  4    Period  Weeks    Status  Partially Met    Target Date  04/08/19      PT LONG TERM GOAL #6   Title  Pt will increase Berg score to at least 53/56 to demonstrate decrease fall risk    Baseline  Pt Berg score 48/56 01/19/2019; Pt Berg score 53/56 02/11/2019    Time  4    Period  Weeks    Status  Achieved    Target Date  02/11/19            Plan - 03/25/19 0900    Clinical Impression Statement  Pt. demonstrated marked improvement in ankle/ LE muscle control while ambulating on Airex pads/ resisted gait.  No LOB during tx. session and progressing well with UE/LE strengthening program.  No c/o pain.  Pt. has 1 more appt. scheduled next week and PT will reassess goals and discuss POC.    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    Clinical Decision Making  Moderate    Rehab Potential  Good    PT Frequency  2x / week    PT Duration  4 weeks    PT Treatment/Interventions  ADLs/Self Care Home Management;Biofeedback;Cryotherapy;Electrical Stimulation;Moist Heat;Traction;DME Instruction;Gait training;Stair training;Functional mobility training;Therapeutic activities;Therapeutic exercise;Balance training;Neuromuscular re-education;Patient/family education;Orthotic Fit/Training;Manual techniques;Passive range of motion;Energy conservation    PT Next Visit Plan  Progress LE strength/ balance with weights, UE exercises.  CHECK GOALS/ SCHEDULE    PT Home Exercise Plan  [email or web address] Consulted and Agree with Plan of Care  Patient       Patient will benefit from skilled therapeutic intervention in order to improve the following deficits and impairments:  Abnormal gait, Impaired sensation, Improper body  mechanics, Pain, Decreased coordination, Decreased mobility, Postural dysfunction, Decreased activity tolerance, Decreased endurance, Decreased range of motion, Decreased strength, Hypomobility, Impaired UE functional use, Difficulty walking, Impaired flexibility, Decreased balance  Visit Diagnosis:  Antalgic gait  Decreased right shoulder range of motion  Muscle weakness (generalized)  Pain in both upper extremities  Other abnormalities of gait and mobility     Problem List There are no active problems to display for this patient.  Pura Spice, PT, DPT # [phone] 03/25/2019, 9:04 AM  [facility] [address] [place], [place], [address] Phone: [phone]   Fax:  [phone]  Name: Lonnie Rodriguez MRN: [id number] Date of Birth: 11-02-1950

## 2019-03-30 ENCOUNTER — Encounter: Payer: Self-pay | Admitting: Physical Therapy

## 2019-03-30 ENCOUNTER — Other Ambulatory Visit: Payer: Self-pay

## 2019-03-30 ENCOUNTER — Ambulatory Visit: Payer: Medicare Other | Admitting: Physical Therapy

## 2019-03-30 DIAGNOSIS — R2689 Other abnormalities of gait and mobility: Secondary | ICD-10-CM

## 2019-03-30 DIAGNOSIS — M79601 Pain in right arm: Secondary | ICD-10-CM

## 2019-03-30 DIAGNOSIS — M25611 Stiffness of right shoulder, not elsewhere classified: Secondary | ICD-10-CM

## 2019-03-30 DIAGNOSIS — M6281 Muscle weakness (generalized): Secondary | ICD-10-CM

## 2019-03-30 NOTE — Therapy (Signed)
[facility] [address] [place], [place], [address] Phone: [phone]   Fax:  [phone]  Physical Therapy Treatment  Patient Details  Name: Lonnie Rodriguez MRN: [id number] Date of Birth: 1950-12-27 Referring Provider (PT): Deetta Perla, MD   Encounter Date: 03/30/2019  PT End of Session - 03/30/19 0812    Visit Number  22    Number of Visits  25    Date for PT Re-Evaluation  04/08/19    Authorization - Visit Number  6    Authorization - Number of Visits  10    PT Start Time  0801    PT Stop Time  0852    PT Time Calculation (min)  51 min    Activity Tolerance  Patient tolerated treatment well    Behavior During Therapy  [facility] for tasks assessed/performed       History reviewed. No pertinent past medical history.  History reviewed. No pertinent surgical history.  There were no vitals filed for this visit.  Subjective Assessment - 03/30/19 1141    Subjective  Pt. states he is doing well.  No new complaints.    Pertinent History  Pt reports he had home health post-surgery and that his HEP includes heel raises with UE support, hip abduction with UE support, marching with UE support, and squats with UE support.    Limitations  Walking;House hold activities    Patient Stated Goals  ambulate without cane, be able to lift R arm without pain    Currently in Pain?  No/denies          Therapeutic Exercise:  Nustep L6x 22mn.B UE/LE Walking in //-bars: Airex step ups/overs for ankle control with no UE assist 5x.  Walking around PT clinic/ high marching and lateral walking in //-bars 6x each (no UE assist).  Nautilus: seated lat. Pull downs 40#/ tricep extension 30#/ B sh. Adduction with handles 30#/ scap. Retraction 40# - 20x2 each.  Nautilus: walk outs (all planes) with 70# 6x each (no UE assist)- cuing to increase heel/ toe strike. Ascending and descending stairs6x(light UE assist on handrails).  Reviewed  HEP    PT Long Term Goals - 03/30/19 1144      PT LONG TERM GOAL #1   Title  Pt will score 55 on FOTO to indicate improved functional mobility    Baseline  FOTO score 45 01/13/2019.  11/5: 61 (goal met    Time  4    Period  Weeks    Status  Achieved    Target Date  03/11/19      PT LONG TERM GOAL #2   Title  Pt will increase cervical and R UE ROM to [facility] pain in order to be able to reach overhead to complete ADLs.    Baseline  Cervical AROM grossly limited (Extension: 39 deg, L rotn: 44 deg, R rotn: 44 deg, L/R lat flex: 28 deg), UE AROM WNL except R shoulder flexion (146 deg), abduction (128 deg), and external rotation (75 deg.)- no pain    Time  4    Period  Weeks    Status  Achieved    Target Date  03/11/19      PT LONG TERM GOAL #3   Title  Pt will ambulate with LRD for 5 minutes with improved upright posture and a gait speed of at least 1.232m to improve community participation.    Baseline  Gait speed with no AD is  0.55ms, with SPC 0.915m, with forward head posture and rounded shoulders 01/08/2019; Gait speed with no AD is 1.06 m/s 02/11/2019    Time  8    Period  Weeks    Status  Achieved    Target Date  03/30/19      PT LONG TERM GOAL #4   Title  Pt will perform 5xSTS without UE support in <12 sec to demonstrate increase LE power.    Baseline  5xSTS 16sec without UE support 01/08/2019; 5xSTS 13.6 sec without UE support 02/16/2019.  11/5: 13.5 seconds (no UE assist/ good posture)    Time  4    Period  Weeks    Status  Partially Met    Target Date  03/30/19      PT LONG TERM GOAL #5   Title  Pt will perform 25 heel raises on B LE to demonstrate increased plantarflexor endurance for safety with gait.    Baseline  Heel raise test R LE 3 reps, L LE 8 reps, with significant WB through B LE 01/08/2019; Heel Raise Test: R LE 8 reps, L LE 10 reps, with moderate WB through B UE and decreased ROM B 02/11/2019    Time  4    Period  Weeks    Status  Achieved    Target Date   03/30/19      PT LONG TERM GOAL #6   Title  Pt will increase Berg score to at least 53/56 to demonstrate decrease fall risk    Baseline  Pt Berg score 48/56 01/19/2019; Pt Berg score 53/56 02/11/2019    Time  4    Period  Weeks    Status  Achieved    Target Date  02/11/19            Plan - 03/30/19 1143    Clinical Impression Statement  Pt. has progressed well towards goals with skilled PT services.  Discharge from PT at this time with focus on independent walking/ HEP.  Pt. instructed to contact PT if any issues or questions over next several weeks/ months.    Personal Factors and Comorbidities  Age    Examination-Activity Limitations  LoPhysicist, medicalctivity;Yard Work    Stability/Clinical Decision Making  Evolving/Moderate complexity    Clinical Decision Making  Moderate    Rehab Potential  Excellent    PT Frequency  2x / week    PT Duration  4 weeks    PT Treatment/Interventions  ADLs/Self Care Home Management;Biofeedback;Cryotherapy;Electrical Stimulation;Moist Heat;Traction;DME Instruction;Gait training;Stair training;Functional mobility training;Therapeutic activities;Therapeutic exercise;Balance training;Neuromuscular re-education;Patient/family education;Orthotic Fit/Training;Manual techniques;Passive range of motion;Energy conservation    PT Next Visit Plan  Discharge    PT Home Exercise Plan  Access Code: [facility]  Consulted and Agree with Plan of Care  Patient       Patient will benefit from skilled therapeutic intervention in order to improve the following deficits and impairments:  Abnormal gait, Impaired sensation, Improper body mechanics, Pain, Decreased coordination, Decreased mobility, Postural dysfunction, Decreased activity tolerance, Decreased endurance, Decreased range of motion, Decreased strength, Hypomobility, Impaired UE functional use, Difficulty walking, Impaired flexibility, Decreased  balance  Visit Diagnosis: Antalgic gait  Decreased right shoulder range of motion  Muscle weakness (generalized)  Pain in both upper extremities  Other abnormalities of gait and mobility     Problem List There are no active problems to display for this patient.  MiPura SpicePT, DPT #  [id number] 03/30/2019, 11:47 AM  [facility] [address] [place], [place], [address] Phone: [phone]   Fax:  [phone]  Name: Lonnie Rodriguez MRN: [id number] Date of Birth: 06/07/1950

## 2020-07-11 ENCOUNTER — Other Ambulatory Visit: Payer: Self-pay

## 2020-07-11 ENCOUNTER — Ambulatory Visit: Payer: Medicare PPO | Attending: Neurosurgery

## 2020-07-11 DIAGNOSIS — M5489 Other dorsalgia: Secondary | ICD-10-CM | POA: Insufficient documentation

## 2020-07-11 DIAGNOSIS — R2689 Other abnormalities of gait and mobility: Secondary | ICD-10-CM | POA: Insufficient documentation

## 2020-07-11 DIAGNOSIS — M6281 Muscle weakness (generalized): Secondary | ICD-10-CM | POA: Insufficient documentation

## 2020-07-11 NOTE — Therapy (Signed)
[name] [address] [place], [place], [address] Phone: [phone]   Fax:  [phone]  Physical Therapy Evaluation  Patient Details  Name: Lonnie Rodriguez MRN: [id number] Date of Birth: May 14, 1950 Referring Provider (PT): Lucy Chris, MD   Encounter Date: 07/11/2020   PT End of Session - 07/11/20 0856    Visit Number 1    Number of Visits 12    Date for PT Re-Evaluation 08/22/20    Authorization Type Medicare    Authorization Time Period Eval 07/11/20    Authorization - Visit Number 1    Authorization - Number of Visits 10    Progress Note Due on Visit 10    PT Start Time 0800    PT Stop Time 0845    PT Time Calculation (min) 45 min    Activity Tolerance Patient tolerated treatment well    Behavior During Therapy [facility] for tasks assessed/performed           History reviewed. No pertinent past medical history.  History reviewed. No pertinent surgical history.  There were no vitals filed for this visit.    Subjective Assessment - 07/11/20 0755    Subjective Pt reports he is overall having an increase in R lower back pain.  He also notes R leg is feeling stiff.  He noticed these symptoms increasing in the past few months.  He describes the sensation as an ache.  He states there was no injury that can recall to his lower back.  Symptoms are aggravated with sit to stand or lying down to standing, prolonged walking or standing, walking >15 minutes.  Symptoms are alleviated with Tylenol or icy hot and the relief is minimal.  He tends to feel the worst in the morning; today is a good day though, he reports current 0/10 pain and 9/10 at worst.    Pertinent History he had cervical fusion surgery in 2020    How long can you sit comfortably? 40 min    How long can you stand comfortably? 15 min    How long can you walk comfortably? 15 min    Diagnostic tests x-ray for lower back    Patient Stated Goals to address the pain in R lower  back and to get his R leg feeling like his L leg    Currently in Pain? No/denies    Pain Score 0-No pain    Pain Orientation Right    Pain Type Chronic pain              [facility] PT Assessment - 07/11/20 0001      Assessment   Medical Diagnosis low back pain    Referring Provider (PT) Lucy Chris, MD    Next MD Visit none      Balance Screen   Has the patient fallen in the past 6 months No      Home Environment   Living Environment Private residence    Home Access Stairs to enter    Entrance Stairs-Rails Can reach both         PT Evaluation    Observation: -Gait pattern: pt amb with decreased gait speed, wide base of support, lateral deviation of gait path, decreased hip extension bilaterally observed, 1 episode of reaching for wall for balance in hallway when another pt walked past him  Functional Tests: -Pt requires UE support and increased time for sit to stand, stand to half kneel to reach shoes, and sit  to supine/sidelying transfers today -5x STS: 20 sec today -Stair assessment: pt ascend/descend 4 stairs x 2 with use of bilateral hand rail, ascend with reciprocal gait, descend with step to onto L LE  ROM: - lumbar spine flexion, extension major restricted - R hip flexion AROM/PROM 90 deg, R hip IR 5 deg, L hip ER 15 deg -PAM testing deferred today as pt unsure of his x-ray results and  Strength: MMT Right Left  Hip flexion 3+/5 4+/5  Hip Abduction 3/5 3+/5  Hip Adduction NT/5 NT/5  Knee Extension  4/5 4+/5  Knee Flexion 4/5 4+/5  DF 5/5 5/5   Neuro: -pt reports decreased sensation R L2-L4 dermatomes compared to L -(+) R seated slump (pt reports "tingling in R foot") compared to L (no sx's -balance: pt unable to perform SLS on R <2 sec without excessive trunk sway and reach for hand rail, L SLS x  sec    Objective measurements completed on examination: See above findings.        PT Education - 07/11/20 0856    Education Details PT POC/goals     Person(s) Educated Patient    Methods Explanation    Comprehension Verbalized understanding            PT Short Term Goals - 07/11/20 0910      PT SHORT TERM GOAL #1   Title Pt will demonstrate ability to independently perform a HEP for LE flexibility, LE ROM, LE strength    Time 4    Period Weeks    Status New    Target Date 08/08/20             PT Long Term Goals - 07/11/20 0911      PT LONG TERM GOAL #1   Title Pt will improve FOTO score to 58 to indiecate improved functional mobility    Baseline 07/11/20: 42    Time 6    Period Weeks    Status New    Target Date 08/22/20      PT LONG TERM GOAL #2   Title Pt will perform 5x STS in <15 sec indicating improved LE strength and decreased fall risk    Baseline 07/11/20: 20 sec    Time 6    Period Weeks    Status New    Target Date 08/22/20      PT LONG TERM GOAL #3   Title Improve R LE hip flexion, abd, extension to >4/5 all to improve his ability to perform transfers sit to stand with reduced LBP    Baseline 07/11/20: R hip flexion 3+/5    Time 6    Period Weeks    Status New    Target Date 08/22/20      PT LONG TERM GOAL #4   Title Pt will be able to perform SLS x >5 seconds R and L LE without UE support to improve safety with ambulation and reduce rall risk    Baseline unable    Time 6    Period Weeks                  Plan - 07/11/20 0859    Clinical Impression Statement Pt is a 70 y/o male who presents to PT with R low back pain and R LE stiffness/weakness.  He is an appropriate candidate for skilled PT to address lumbar spine and LE ROM and flexibility deficits; LE strength deficits, impaired balance, gait/posture abnormalities, and overall activity tolerance.  He is motivated to participate in PT and should do well with a course of skilled PT intervention to address these impairments as they are affecting his ability to stand, walk, transfer in and out of bed/chair/floor.  He also would benefit from  incorporating balance training into his course of PT as he demonstrates impaired balance and has h/o near falls in the past few months.    Personal Factors and Comorbidities Age;Past/Current Experience;Comorbidity 1    Comorbidities HTN    Examination-Activity Limitations Bed Mobility;Bend;Dressing;Locomotion Level;Sit;Squat;Stairs;Stand;Transfers    Examination-Participation Restrictions Community Activity;Yard Work    Conservation officer, historic buildings Evolving/Moderate complexity    Clinical Decision Making Moderate    Rehab Potential Good    PT Frequency 2x / week    PT Duration 6 weeks    PT Treatment/Interventions ADLs/Self Care Home Management;Cryotherapy;DME Instruction;Moist Heat;Gait training;Stair training;Functional mobility training;Therapeutic activities;Therapeutic exercise;Balance training;Neuromuscular re-education;Manual techniques;Patient/family education;Passive range of motion    PT Next Visit Plan initiate HEP, begin hip ROM/flexibility, STM R gluteal mmm, lumbar/pelvic/hip strengthening R>L, balance    Consulted and Agree with Plan of Care Patient           Patient will benefit from skilled therapeutic intervention in order to improve the following deficits and impairments:  Abnormal gait,Decreased range of motion,Difficulty walking,Decreased endurance,Decreased activity tolerance,Pain,Decreased balance,Hypomobility,Impaired flexibility,Postural dysfunction,Impaired sensation,Decreased strength,Decreased mobility  Visit Diagnosis: Back pain without sciatica  Muscle weakness (generalized)  Other abnormalities of gait and mobility     Problem List There are no problems to display for this patient.   Ardine Bjork 07/11/2020, 11:57 AM Max Fickle, PT, DPT Physical Therapist - [place]   [facility] [address] [place], [place], [address] Phone: [phone]   Fax:  [phone]  Name: MATIS MONNIER MRN: [id number] Date of Birth: 20-Aug-1950

## 2020-07-13 ENCOUNTER — Other Ambulatory Visit: Payer: Self-pay

## 2020-07-13 ENCOUNTER — Ambulatory Visit: Payer: Medicare PPO

## 2020-07-13 DIAGNOSIS — M5489 Other dorsalgia: Secondary | ICD-10-CM

## 2020-07-13 DIAGNOSIS — R2689 Other abnormalities of gait and mobility: Secondary | ICD-10-CM

## 2020-07-13 DIAGNOSIS — M6281 Muscle weakness (generalized): Secondary | ICD-10-CM

## 2020-07-13 NOTE — Therapy (Signed)
[facility] [address] [place], [place], [address] Phone: [phone]   Fax:  [phone]  Physical Therapy Treatment  Patient Details  Name: LELA GELL MRN: [id number] Date of Birth: 12-25-1950 Referring Provider (PT): Lucy Chris, MD   Encounter Date: 07/13/2020   PT End of Session - 07/13/20 0954    Visit Number 2    Number of Visits 12    Date for PT Re-Evaluation 08/22/20    Authorization Type Medicare    Authorization Time Period Eval 07/11/20    Authorization - Visit Number 2    Authorization - Number of Visits 10    Progress Note Due on Visit 10    PT Start Time 0800    PT Stop Time 0845    PT Time Calculation (min) 45 min    Activity Tolerance Patient tolerated treatment well    Behavior During Therapy [facility] for tasks assessed/performed           No past medical history on file.  No past surgical history on file.  There were no vitals filed for this visit.   Subjective Assessment - 07/13/20 0803    Subjective Pt reports he was not too sore after last session.  He does notes having difficulty with his balance when he is walking holding an item in his hands- he leans to the R.    Pertinent History he had cervical fusion surgery in 2020    How long can you sit comfortably? 40 min    How long can you stand comfortably? 15 min    How long can you walk comfortably? 15 min    Diagnostic tests x-ray for lower back    Patient Stated Goals to address the pain in R lower back and to get his R leg feeling like his L leg    Currently in Pain? No/denies    Pain Score 0-No pain           Treatment Today:  10 m walk test in 13.2 sec: 0.76 m/s gait speed (<1.0 m/s indicates increased risk for falls; limited community ambulator) PT discussed pt bringing his SPC to clinic next session to initiate further instruction to improve safety during amb  SKTC R and L: 20 sec x 4 ea (used towel behind knee) Hooklying trunk  rotation: x10 ea, then 30 sec holds x 2 R, x1 L Sciatic glide x 10 ea LE TA activation in hooklying: 5 sec x 20 Pelvic tilts: posterior tilt x 15 Seated hip abd with blue band: 5 sec holds x20   HEP instruction:  Access Code: [email or web address] URL: [email or web address] Date: 07/13/2020 Prepared by: Max Fickle  Exercises  . Supine Lower Trunk Rotation - 1 x daily - 2 sets - 10 reps . Hooklying Single Knee to Chest Stretch - 1 x daily - 3 sets - 1 reps - 30 hold . Supine Sciatic Nerve Glide - 1 x daily - 2 sets - 10 reps Supine Transversus Abdominis Bracing - Hands on Ground - 1 x daily - 2 sets - 10 reps           PT Education - 07/13/20 0852    Education Details HEP (see note) in medbridge    Person(s) Educated Patient    Methods Explanation;Demonstration    Comprehension Verbalized understanding            PT Short Term Goals - 07/11/20 0910      PT SHORT TERM  GOAL #1   Title Pt will demonstrate ability to independently perform a HEP for LE flexibility, LE ROM, LE strength    Time 4    Period Weeks    Status New    Target Date 08/08/20             PT Long Term Goals - 07/11/20 0911      PT LONG TERM GOAL #1   Title Pt will improve FOTO score to 58 to indiecate improved functional mobility    Baseline 07/11/20: 42    Time 6    Period Weeks    Status New    Target Date 08/22/20      PT LONG TERM GOAL #2   Title Pt will perform 5x STS in <15 sec indicating improved LE strength and decreased fall risk    Baseline 07/11/20: 20 sec    Time 6    Period Weeks    Status New    Target Date 08/22/20      PT LONG TERM GOAL #3   Title Improve R LE hip flexion, abd, extension to >4/5 all to improve his ability to perform transfers sit to stand with reduced LBP    Baseline 07/11/20: R hip flexion 3+/5    Time 6    Period Weeks    Status New    Target Date 08/22/20      PT LONG TERM GOAL #4   Title Pt will be able to perform SLS x >5 seconds R and  L LE without UE support to improve safety with ambulation and reduce rall risk    Baseline unable    Time 6    Period Weeks                 Plan - 07/13/20 5681    Clinical Impression Statement Pt tolerated treatment session well initiating LE and spine ROM and mobility and TA activation exercises.  PT performed 10 m walk test as pt cont to amb with gait deviations including narrow base of support, decreased gait speed, and lateral deviation from straight path.  10 m walk test indicates pt is at risk for falls.  Discussed benefit of PT to address this and also instructed pt to bring his [facility] to clinic at next session to further implement training if appropriate to improve safety during amb.    Personal Factors and Comorbidities Age;Past/Current Experience;Comorbidity 1    Comorbidities HTN    Examination-Activity Limitations Bed Mobility;Bend;Dressing;Locomotion Level;Sit;Squat;Stairs;Stand;Transfers    Examination-Participation Restrictions Community Activity;Yard Work    Conservation officer, historic buildings Evolving/Moderate complexity    Rehab Potential Good    PT Frequency 2x / week    PT Duration 6 weeks    PT Treatment/Interventions ADLs/Self Care Home Management;Cryotherapy;DME Instruction;Moist Heat;Gait training;Stair training;Functional mobility training;Therapeutic activities;Therapeutic exercise;Balance training;Neuromuscular re-education;Manual techniques;Patient/family education;Passive range of motion    PT Next Visit Plan initiate HEP, begin hip ROM/flexibility, STM R gluteal mmm, lumbar/pelvic/hip strengthening R>L, balance    PT Home Exercise Plan Access Code: [email or web address]    Consulted and Agree with Plan of Care Patient           Patient will benefit from skilled therapeutic intervention in order to improve the following deficits and impairments:  Abnormal gait,Decreased range of motion,Difficulty walking,Decreased endurance,Decreased activity tolerance,Pain,Decreased  balance,Hypomobility,Impaired flexibility,Postural dysfunction,Impaired sensation,Decreased strength,Decreased mobility  Visit Diagnosis: Back pain without sciatica  Other abnormalities of gait and mobility  Muscle weakness (generalized)     Problem List There are  no problems to display for this patient.   Ardine Bjork 07/13/2020, 10:06 AM Max Fickle, PT, DPT    [facility] [address] [place], [place], [address] Phone: [phone]   Fax:  [phone]  Name: REMON QUINTO MRN: [id number] Date of Birth: 1950/09/25

## 2020-07-18 ENCOUNTER — Other Ambulatory Visit: Payer: Self-pay

## 2020-07-18 ENCOUNTER — Ambulatory Visit: Payer: Medicare PPO

## 2020-07-18 DIAGNOSIS — M5489 Other dorsalgia: Secondary | ICD-10-CM | POA: Diagnosis not present

## 2020-07-18 DIAGNOSIS — R2689 Other abnormalities of gait and mobility: Secondary | ICD-10-CM

## 2020-07-18 DIAGNOSIS — M6281 Muscle weakness (generalized): Secondary | ICD-10-CM

## 2020-07-18 NOTE — Therapy (Signed)
[facility] [address] [place], [place], [address] Phone: [phone]   Fax:  [phone]  Physical Therapy Treatment  Patient Details  Name: Lonnie Rodriguez MRN: [id number] Date of Birth: 02-05-1951 Referring Provider (PT): Lucy Chris, MD   Encounter Date: 07/18/2020   PT End of Session - 07/18/20 1244    Visit Number 3    Number of Visits 12    Date for PT Re-Evaluation 08/22/20    Authorization Type Medicare    Authorization Time Period Eval 07/11/20    Authorization - Visit Number 3    Authorization - Number of Visits 10    Progress Note Due on Visit 10    PT Start Time 0800    PT Stop Time 0845    PT Time Calculation (min) 45 min    Activity Tolerance Patient tolerated treatment well    Behavior During Therapy [facility] for tasks assessed/performed           No past medical history on file.  No past surgical history on file.  There were no vitals filed for this visit.   Subjective Assessment - 07/18/20 0755    Subjective Pt reports he is overall feeling good this morning, he was sore on Saturday and a little better on Sunday.  He thinks maybe he did his HEP "too [place]."  He brought his quad cane with him today.    Pertinent History he had cervical fusion surgery in 2020    How long can you sit comfortably? 40 min    How long can you stand comfortably? 15 min    How long can you walk comfortably? 15 min    Diagnostic tests x-ray for lower back    Patient Stated Goals to address the pain in R lower back and to get his R leg feeling like his L leg    Currently in Pain? No/denies           Treatment Today:  SKTC R and L: 20 sec x 4 ea (used towel behind knee) Hooklying trunk rotation: x10 ea, then 30 sec holds x 2 R, x1 L Sciatic glide x 10 ea LE TA activation in hooklying: 5 sec x 20 TA activation with small alt marches 2x5 Pelvic tilts: posterior tilt x 15- not today TA activation in sitting x 15 TA + sit to  stand 2x5 Seated hip abd with blue band: 5 sec holds x20  pt education for purpose/benefit of SPC for balance and to improve safety during ambulation as well as being able to amb for longer distance or time to improve overall strength and endurance.    Pt practiced using SPC for amb in clinic on flat surface (6 laps in clinic hallway).  PT observed he amb with straight path, less deviation to R noted, able to achieve good sequence with cane in L hand and R foot placement.  He is more steady with SPC than his quad cane today.  Recommend using SPC instead of quad cane for community amb.      PT Education - 07/18/20 1244    Education Details SPC use/training    Person(s) Educated Patient    Methods Explanation;Demonstration    Comprehension Verbalized understanding;Returned demonstration            PT Short Term Goals - 07/11/20 0910      PT SHORT TERM GOAL #1   Title Pt will demonstrate ability to independently perform a HEP  for LE flexibility, LE ROM, LE strength    Time 4    Period Weeks    Status New    Target Date 08/08/20             PT Long Term Goals - 07/11/20 0911      PT LONG TERM GOAL #1   Title Pt will improve FOTO score to 58 to indiecate improved functional mobility    Baseline 07/11/20: 42    Time 6    Period Weeks    Status New    Target Date 08/22/20      PT LONG TERM GOAL #2   Title Pt will perform 5x STS in <15 sec indicating improved LE strength and decreased fall risk    Baseline 07/11/20: 20 sec    Time 6    Period Weeks    Status New    Target Date 08/22/20      PT LONG TERM GOAL #3   Title Improve R LE hip flexion, abd, extension to >4/5 all to improve his ability to perform transfers sit to stand with reduced LBP    Baseline 07/11/20: R hip flexion 3+/5    Time 6    Period Weeks    Status New    Target Date 08/22/20      PT LONG TERM GOAL #4   Title Pt will be able to perform SLS x >5 seconds R and L LE without UE support to improve safety  with ambulation and reduce rall risk    Baseline unable    Time 6    Period Weeks                 Plan - 07/18/20 1244    Clinical Impression Statement Pt able to perform sit to stand transfer today with improved control and without pain when he utilized TA bracing during transfer.  Reviewed his HEP technique and answered questions to support his adherence with this at home.  He was able to ambulate with improvd gait pattern and for longer duration of time today with use of SPC than without AD at last session.    Personal Factors and Comorbidities Age;Past/Current Experience;Comorbidity 1    Comorbidities HTN    Examination-Activity Limitations Bed Mobility;Bend;Dressing;Locomotion Level;Sit;Squat;Stairs;Stand;Transfers    Examination-Participation Restrictions Community Activity;Yard Work    Conservation officer, historic buildings Evolving/Moderate complexity    Rehab Potential Good    PT Frequency 2x / week    PT Duration 6 weeks    PT Treatment/Interventions ADLs/Self Care Home Management;Cryotherapy;DME Instruction;Moist Heat;Gait training;Stair training;Functional mobility training;Therapeutic activities;Therapeutic exercise;Balance training;Neuromuscular re-education;Manual techniques;Patient/family education;Passive range of motion    PT Next Visit Plan initiate HEP, begin hip ROM/flexibility, STM R gluteal mmm, lumbar/pelvic/hip strengthening R>L, balance    PT Home Exercise Plan Access Code: [email or web address]    Consulted and Agree with Plan of Care Patient           Patient will benefit from skilled therapeutic intervention in order to improve the following deficits and impairments:  Abnormal gait,Decreased range of motion,Difficulty walking,Decreased endurance,Decreased activity tolerance,Pain,Decreased balance,Hypomobility,Impaired flexibility,Postural dysfunction,Impaired sensation,Decreased strength,Decreased mobility  Visit Diagnosis: Back pain without sciatica  Other  abnormalities of gait and mobility  Muscle weakness (generalized)     Problem List There are no problems to display for this patient.   Ardine Bjork 07/18/2020, 12:49 PM Max Fickle, PT, DPT  [facility] [address] [place], [place], [address] Phone: [phone]  Fax:  [phone]  Name: COSTON MANDATO MRN: [id number] Date of Birth: [birth year]

## 2020-07-20 ENCOUNTER — Ambulatory Visit: Payer: Medicare PPO

## 2020-07-20 ENCOUNTER — Other Ambulatory Visit: Payer: Self-pay

## 2020-07-20 DIAGNOSIS — R2689 Other abnormalities of gait and mobility: Secondary | ICD-10-CM

## 2020-07-20 DIAGNOSIS — M5489 Other dorsalgia: Secondary | ICD-10-CM

## 2020-07-20 DIAGNOSIS — M6281 Muscle weakness (generalized): Secondary | ICD-10-CM

## 2020-07-20 NOTE — Therapy (Signed)
[facility] [address] [place], [place], [address] Phone: [phone]   Fax:  [phone]  Physical Therapy Treatment  Patient Details  Name: Lonnie Rodriguez MRN: [id number] Date of Birth: [birth year] Referring Provider (PT): Lucy Chris, MD   Encounter Date: 07/20/2020   PT End of Session - 07/20/20 0859    Visit Number 4    Number of Visits 12    Date for PT Re-Evaluation 08/22/20    Authorization Type Medicare    Authorization Time Period Eval 07/11/20    Authorization - Visit Number 4    Authorization - Number of Visits 10    Progress Note Due on Visit 10    PT Start Time 0800    PT Stop Time 0845    PT Time Calculation (min) 45 min    Activity Tolerance Patient tolerated treatment well    Behavior During Therapy [facility] for tasks assessed/performed           No past medical history on file.  No past surgical history on file.  There were no vitals filed for this visit.   Subjective Assessment - 07/20/20 0804    Subjective Pt got a new SPC at the pharmacy, he states it is feeling much more comfortable using it than the quad cane.  His back is feeling good upon arrival, no pain to report.    Pertinent History he had cervical fusion surgery in 2020    How long can you sit comfortably? 40 min    How long can you stand comfortably? 15 min    How long can you walk comfortably? 15 min    Diagnostic tests x-ray for lower back    Patient Stated Goals to address the pain in R lower back and to get his R leg feeling like his L leg           Treatment Today:  Assessed his SPC height upon arrival, no adjustments needed  Therapeutic Exercises: Nustep: seat 11, arms 11, level 5 x 6 min, goal steps 75-80 SPM for endurance/LE strengthening Side stepping in parallel bars- 4 laps Mini squats with TA bracing: 3x8 (cues for breathing, pt counted out loud) SLS with TA bracing- min UE support 5 sec holds, 5x ea LE Hip add with ball  between knees 5 sec x 20 Hip abd with blue band seated 5 sec x 20 Sit to stand with TA bracing 2x5 SKTC: R/L 3x20 sec Hooklying trunk rotation x15 ea direction     PT Education - 07/20/20 0858    Education Details TA bracing while not holding breath- counting out loud as a strategy to reduce breath holding    Person(s) Educated Patient    Methods Explanation;Demonstration    Comprehension Verbalized understanding;Returned demonstration;Need further instruction            PT Short Term Goals - 07/11/20 0910      PT SHORT TERM GOAL #1   Title Pt will demonstrate ability to independently perform a HEP for LE flexibility, LE ROM, LE strength    Time 4    Period Weeks    Status New    Target Date 08/08/20             PT Long Term Goals - 07/11/20 0911      PT LONG TERM GOAL #1   Title Pt will improve FOTO score to 58 to indiecate improved functional mobility    Baseline 07/11/20: 42  Time 6    Period Weeks    Status New    Target Date 08/22/20      PT LONG TERM GOAL #2   Title Pt will perform 5x STS in <15 sec indicating improved LE strength and decreased fall risk    Baseline 07/11/20: 20 sec    Time 6    Period Weeks    Status New    Target Date 08/22/20      PT LONG TERM GOAL #3   Title Improve R LE hip flexion, abd, extension to >4/5 all to improve his ability to perform transfers sit to stand with reduced LBP    Baseline 07/11/20: R hip flexion 3+/5    Time 6    Period Weeks    Status New    Target Date 08/22/20      PT LONG TERM GOAL #4   Title Pt will be able to perform SLS x >5 seconds R and L LE without UE support to improve safety with ambulation and reduce rall risk    Baseline unable    Time 6    Period Weeks                 Plan - 07/20/20 0900    Clinical Impression Statement Pt tolerated progression of strengthening well today without report of low back pain at end of session.  He does still have difficulty coordinating TA activation  with movement and should continue to benefit from more practice.  Counting out loud helped reduce breath holding pattern during TA activation.  Plan to continue progressing with strength at next session, especially with hip and core mm retraining at next session.    Personal Factors and Comorbidities Age;Past/Current Experience;Comorbidity 1    Comorbidities HTN    Examination-Activity Limitations Bed Mobility;Bend;Dressing;Locomotion Level;Sit;Squat;Stairs;Stand;Transfers    Examination-Participation Restrictions Community Activity;Yard Work    Conservation officer, historic buildings Evolving/Moderate complexity    Rehab Potential Good    PT Frequency 2x / week    PT Duration 6 weeks    PT Treatment/Interventions ADLs/Self Care Home Management;Cryotherapy;DME Instruction;Moist Heat;Gait training;Stair training;Functional mobility training;Therapeutic activities;Therapeutic exercise;Balance training;Neuromuscular re-education;Manual techniques;Patient/family education;Passive range of motion    PT Next Visit Plan lumbar/pelvic/hip strengthening R>L, balance, standing TA exercises    PT Home Exercise Plan Access Code: [email or web address]    Consulted and Agree with Plan of Care Patient           Patient will benefit from skilled therapeutic intervention in order to improve the following deficits and impairments:  Abnormal gait,Decreased range of motion,Difficulty walking,Decreased endurance,Decreased activity tolerance,Pain,Decreased balance,Hypomobility,Impaired flexibility,Postural dysfunction,Impaired sensation,Decreased strength,Decreased mobility  Visit Diagnosis: Back pain without sciatica  Other abnormalities of gait and mobility  Muscle weakness (generalized)     Problem List There are no problems to display for this patient.   Ardine Bjork 07/20/2020, 9:12 AM Max Fickle, PT, DPT   [facility] [address] [place], [place],  [address] Phone: [phone]   Fax:  [phone]  Name: Lonnie Rodriguez MRN: [id number] Date of Birth: 15-Feb-1951

## 2020-07-25 ENCOUNTER — Other Ambulatory Visit: Payer: Self-pay

## 2020-07-25 ENCOUNTER — Ambulatory Visit: Payer: Medicare PPO

## 2020-07-25 DIAGNOSIS — M6281 Muscle weakness (generalized): Secondary | ICD-10-CM

## 2020-07-25 DIAGNOSIS — M5489 Other dorsalgia: Secondary | ICD-10-CM | POA: Diagnosis not present

## 2020-07-25 DIAGNOSIS — R2689 Other abnormalities of gait and mobility: Secondary | ICD-10-CM

## 2020-07-25 NOTE — Therapy (Signed)
[facility] [address] [place], [place], [address] Phone: [phone]   Fax:  [phone]  Physical Therapy Treatment  Patient Details  Name: Lonnie Rodriguez MRN: [id number] Date of Birth: [birth year] Referring Provider (PT): Lucy Chris, MD   Encounter Date: 07/25/2020   PT End of Session - 07/25/20 1129    Visit Number 5    Number of Visits 12    Date for PT Re-Evaluation 08/22/20    Authorization Type Medicare    Authorization Time Period Eval 07/11/20    Authorization - Visit Number 5    Authorization - Number of Visits 10    Progress Note Due on Visit 10    PT Start Time 0755    PT Stop Time 0840    PT Time Calculation (min) 45 min    Equipment Utilized During Treatment Gait belt    Activity Tolerance Patient tolerated treatment well    Behavior During Therapy [facility] for tasks assessed/performed           No past medical history on file.  No past surgical history on file.  There were no vitals filed for this visit.   Subjective Assessment - 07/25/20 0758    Subjective Pt states his lower back was pain free yesterday.  This morning he rates it as 3/10.  Overall, he feels like he is making progress with PT.    Pertinent History he had cervical fusion surgery in 2020    How long can you sit comfortably? 40 min    How long can you stand comfortably? 15 min    How long can you walk comfortably? 15 min    Diagnostic tests x-ray for lower back    Patient Stated Goals to address the pain in R lower back and to get his R leg feeling like his L leg           Treatment Today:   Nustep: seat 11, arms 11, level 5 x 6 min, goal steps 80-85 SPM for endurance/LE strengthening Side stepping in parallel bars- 4 laps Mini squats with TA bracing: 2x5 with 5 sec holds (cues for breathing, pt counted out loud) Lateral step ups onto R (6 inch): 15x Lateral step ups x 3 in a row- to simulate stepping up onto running board and then  into his vehicle which is high off the ground x 5  SLS with TA bracing- min UE support 5 sec holds, 5x ea LE  Hip add with ball between knees 5 sec x 20 Hip abd with blue band seated 5 sec x 20 Sit to stand with TA bracing 2x5 (best time: 18.8 sec)  Lateral stepping with band around trunk- R and L 3 laps, 30 lbs resistance  Amb in hallway with PT (4 laps), PT SBA with gait belt, 2 laps with head turns R/L and 1 lap with vertical head movement up/down- no major loss of balance into the wall noted, gait speed decreased with dual tasking though. 10 m walk today: 9.2 sec       PT Education - 07/25/20 1128    Education Details exercise technique/pacing    Person(s) Educated Patient    Methods Explanation;Demonstration    Comprehension Verbalized understanding;Returned demonstration            PT Short Term Goals - 07/25/20 1131      PT SHORT TERM GOAL #1   Title Pt will demonstrate ability to independently perform a HEP  for LE flexibility, LE ROM, LE strength    Baseline 3/22- progressing    Time 4    Period Weeks    Status New    Target Date 08/08/20             PT Long Term Goals - 07/25/20 1132      PT LONG TERM GOAL #1   Title Pt will improve FOTO score to 58 to indiecate improved functional mobility    Baseline 07/11/20: 42    Time 6    Period Weeks    Status New      PT LONG TERM GOAL #2   Title Pt will perform 5x STS in <15 sec indicating improved LE strength and decreased fall risk    Baseline 07/11/20: 20 sec, 3/22: 18 sec    Time 6    Period Weeks    Status New      PT LONG TERM GOAL #3   Title Improve R LE hip flexion, abd, extension to >4/5 all to improve his ability to perform transfers sit to stand with reduced LBP    Baseline 07/11/20: R hip flexion 3+/5    Time 6    Period Weeks    Status New      PT LONG TERM GOAL #4   Title Pt will be able to perform SLS x >5 seconds R and L LE without UE support to improve safety with ambulation and reduce  rall risk    Baseline unable; 3/22: 4-5 sec with UE support    Time 6    Period Weeks                 Plan - 07/25/20 1129    Clinical Impression Statement Pt was able to perform standing strengthening exercises today with only 1 seated rest break today.  He has difficulty with lateral band walking to L due to impaired strength in R LE/trunk mm during push off.  Sit to stand and 10 m walk tests are improving, supporting pt making progress with current tx plan.    Personal Factors and Comorbidities Age;Past/Current Experience;Comorbidity 1    Comorbidities HTN    Examination-Activity Limitations Bed Mobility;Bend;Dressing;Locomotion Level;Sit;Squat;Stairs;Stand;Transfers    Examination-Participation Restrictions Community Activity;Yard Work    Conservation officer, historic buildings Evolving/Moderate complexity    Rehab Potential Good    PT Frequency 2x / week    PT Duration 6 weeks    PT Treatment/Interventions ADLs/Self Care Home Management;Cryotherapy;DME Instruction;Moist Heat;Gait training;Stair training;Functional mobility training;Therapeutic activities;Therapeutic exercise;Balance training;Neuromuscular re-education;Manual techniques;Patient/family education;Passive range of motion    PT Next Visit Plan lumbar/pelvic/hip strengthening R>L, balance- especially with dual tasking while amb and uneven surfaces    PT Home Exercise Plan Access Code: [email or web address]    Consulted and Agree with Plan of Care Patient           Patient will benefit from skilled therapeutic intervention in order to improve the following deficits and impairments:  Abnormal gait,Decreased range of motion,Difficulty walking,Decreased endurance,Decreased activity tolerance,Pain,Decreased balance,Hypomobility,Impaired flexibility,Postural dysfunction,Impaired sensation,Decreased strength,Decreased mobility  Visit Diagnosis: Back pain without sciatica  Other abnormalities of gait and mobility  Muscle weakness  (generalized)     Problem List There are no problems to display for this patient.   Ardine Bjork 07/25/2020, 11:35 AM Max Fickle, PT, DPT Physical Therapist - [place]  [facility] [address] [place], [place], [address] Phone: [phone]   Fax:  [phone]  Name: Keagan  COLBIE DANNER MRN: [id number] Date of Birth: [birth year]

## 2020-07-27 ENCOUNTER — Ambulatory Visit: Payer: Medicare PPO

## 2020-07-27 ENCOUNTER — Other Ambulatory Visit: Payer: Self-pay

## 2020-07-27 DIAGNOSIS — R2689 Other abnormalities of gait and mobility: Secondary | ICD-10-CM

## 2020-07-27 DIAGNOSIS — M5489 Other dorsalgia: Secondary | ICD-10-CM

## 2020-07-27 DIAGNOSIS — M6281 Muscle weakness (generalized): Secondary | ICD-10-CM

## 2020-07-27 NOTE — Therapy (Signed)
[facility] [address] [place], [place], [address] Phone: [phone]   Fax:  [phone]  Physical Therapy Treatment  Patient Details  Name: Lonnie Rodriguez MRN: [id number] Date of Birth: Jun 26, 1950 Referring Provider (PT): Lucy Chris, MD   Encounter Date: 07/27/2020   PT End of Session - 07/27/20 0816    Visit Number 6    Number of Visits 12    Date for PT Re-Evaluation 08/22/20    Authorization Type Medicare    Authorization Time Period Eval 07/11/20    Authorization - Visit Number 6    Authorization - Number of Visits 10    Progress Note Due on Visit 10    PT Start Time 0755    PT Stop Time 0840    PT Time Calculation (min) 45 min    Equipment Utilized During Treatment Gait belt    Activity Tolerance Patient tolerated treatment well    Behavior During Therapy [facility] for tasks assessed/performed           No past medical history on file.  No past surgical history on file.  There were no vitals filed for this visit.   Subjective Assessment - 07/27/20 0757    Subjective Pt states he felt good after last session, he did not have an increase in pain.  Today he rates LBP as 3/10.  He finds he has been able to walk more with his cane and walk faster.    Pertinent History he had cervical fusion surgery in 2020    How long can you sit comfortably? 40 min    How long can you stand comfortably? 25-30 min    How long can you walk comfortably? 15 min    Diagnostic tests x-ray for lower back    Patient Stated Goals to address the pain in R lower back and to get his R leg feeling like his L leg    Currently in Pain? Yes    Pain Score 3            Treatment Today: Nustep: seat 11, arms 11, level 5 x 6 min, goal steps 80-85 SPM for endurance/LE strengthening Standing Marches 2x10 Standing Hamstring curls 2x10 Front step ups onto R (6 inch): 15x with single UE support Lateral step ups onto R (6 inch): 15x with single UE  support   SLS with TA bracing- min UE support 5 sec holds, 5x ea LE- not today   Hip add with ball between knees 5 sec x 20 Hip abd with blue band seated 5 sec x 20 Sit to stand with TA bracing 2x7   Lateral stepping with band around trunk- R and L 4 laps, 30 lbs resistance   Amb in hallway with PT (4 laps), PT SBA with gait belt, 4 laps holding ball and handing to PT to R/L at varied levels; 2 laps holding ball focusing on bracing TA- PT observes pt's ability to activate posterior chain declines when he is holding something, and increased transverse plane motion noted in trunk as a substitution.          PT Education - 07/27/20 0816    Education Details exercise technique/pacing    Person(s) Educated Patient    Methods Explanation    Comprehension Verbalized understanding;Returned demonstration            PT Short Term Goals - 07/25/20 1131      PT SHORT TERM GOAL #1   Title  Pt will demonstrate ability to independently perform a HEP for LE flexibility, LE ROM, LE strength    Baseline 3/22- progressing    Time 4    Period Weeks    Status New    Target Date 08/08/20             PT Long Term Goals - 07/25/20 1132      PT LONG TERM GOAL #1   Title Pt will improve FOTO score to 58 to indiecate improved functional mobility    Baseline 07/11/20: 42    Time 6    Period Weeks    Status New      PT LONG TERM GOAL #2   Title Pt will perform 5x STS in <15 sec indicating improved LE strength and decreased fall risk    Baseline 07/11/20: 20 sec, 3/22: 18 sec    Time 6    Period Weeks    Status New      PT LONG TERM GOAL #3   Title Improve R LE hip flexion, abd, extension to >4/5 all to improve his ability to perform transfers sit to stand with reduced LBP    Baseline 07/11/20: R hip flexion 3+/5    Time 6    Period Weeks    Status New      PT LONG TERM GOAL #4   Title Pt will be able to perform SLS x >5 seconds R and L LE without UE support to improve safety with  ambulation and reduce rall risk    Baseline unable; 3/22: 4-5 sec with UE support    Time 6    Period Weeks                 Plan - 07/27/20 6503    Clinical Impression Statement Pt's ability to activate LE posterior chain declines while amb holding an object in his hand instead of utilizing arm swing.  Observed a compensatory increase in transverse plane trunk/pelvic rotation.  He should continue to benefit from skilled PT including strengthening for glutes/posterior chain to improve gait mechanics, and walking endurance and reduce fall risk.    Personal Factors and Comorbidities Age;Past/Current Experience;Comorbidity 1    Comorbidities HTN    Examination-Activity Limitations Bed Mobility;Bend;Dressing;Locomotion Level;Sit;Squat;Stairs;Stand;Transfers    Examination-Participation Restrictions Community Activity;Yard Work    Conservation officer, historic buildings Evolving/Moderate complexity    Rehab Potential Good    PT Frequency 2x / week    PT Duration 6 weeks    PT Treatment/Interventions ADLs/Self Care Home Management;Cryotherapy;DME Instruction;Moist Heat;Gait training;Stair training;Functional mobility training;Therapeutic activities;Therapeutic exercise;Balance training;Neuromuscular re-education;Manual techniques;Patient/family education;Passive range of motion    PT Next Visit Plan lumbar/pelvic/hip strengthening R>L, balance- especially with dual tasking while amb and uneven surfaces    PT Home Exercise Plan Access Code: [email or web address]    Consulted and Agree with Plan of Care Patient           Patient will benefit from skilled therapeutic intervention in order to improve the following deficits and impairments:  Abnormal gait,Decreased range of motion,Difficulty walking,Decreased endurance,Decreased activity tolerance,Pain,Decreased balance,Hypomobility,Impaired flexibility,Postural dysfunction,Impaired sensation,Decreased strength,Decreased mobility  Visit Diagnosis: Back pain  without sciatica  Other abnormalities of gait and mobility  Muscle weakness (generalized)     Problem List There are no problems to display for this patient.   Ardine Bjork 07/27/2020, 10:21 AM Max Fickle, PT, DPT    [place] [facility] [address] [place], [place], [address] Phone: [phone]  Fax:  [phone]  Name: Lonnie Rodriguez MRN: [id number] Date of Birth: [birth year]

## 2020-08-01 ENCOUNTER — Other Ambulatory Visit: Payer: Self-pay

## 2020-08-01 ENCOUNTER — Ambulatory Visit: Payer: Medicare PPO

## 2020-08-01 DIAGNOSIS — M5489 Other dorsalgia: Secondary | ICD-10-CM

## 2020-08-01 DIAGNOSIS — R2689 Other abnormalities of gait and mobility: Secondary | ICD-10-CM

## 2020-08-01 DIAGNOSIS — M6281 Muscle weakness (generalized): Secondary | ICD-10-CM

## 2020-08-01 NOTE — Therapy (Signed)
[facility] [address] [place], [place], [address] Phone: [phone]   Fax:  [phone]  Physical Therapy Treatment  Patient Details  Name: Lonnie Rodriguez MRN: [id number] Date of Birth: 1951/01/21 Referring Provider (PT): Lucy Chris, MD   Encounter Date: 08/01/2020   PT End of Session - 08/01/20 0803    Visit Number 7    Number of Visits 12    Date for PT Re-Evaluation 08/22/20    Authorization Type Medicare    Authorization Time Period Eval 07/11/20    Authorization - Visit Number 7    Authorization - Number of Visits 10    Progress Note Due on Visit 10    PT Start Time 0755    PT Stop Time 0840    PT Time Calculation (min) 45 min    Equipment Utilized During Treatment Gait belt    Activity Tolerance Patient tolerated treatment well    Behavior During Therapy [facility] for tasks assessed/performed           No past medical history on file.  No past surgical history on file.  There were no vitals filed for this visit.   Subjective Assessment - 08/01/20 0800    Subjective Pt reports his back was sore after weed eating his yard work at the end of last week, he felt better by last night.  He continues to work on his HEP.    Pertinent History he had cervical fusion surgery in 2020    How long can you sit comfortably? 40 min    How long can you stand comfortably? 25-30 min    How long can you walk comfortably? 15 min    Diagnostic tests x-ray for lower back    Patient Stated Goals to address the pain in R lower back and to get his R leg feeling like his L leg          Treatment Today:  Therapeutic Exercise- Nustep: seat 11, arms 11, level 5 x 6 min, goal steps 80-85 SPM for endurance/LE strengthening  3 lb ankle weights -Standing Marches 2x10 ea LE -Standing Hamstring curls 2x10 ea LE -Standing hip extension 3x8 ea LE  TA brace with squat 2x10  Neuro re-ed- Airex balance beam- tandem walk 4 laps, side steps 4  laps in parallel bars (1 intermittent hand hold) Hurdles (small)- forward and lateral step overs 4 laps ea Stand on airex- alt foot placement on 6 inch step 2x10 (intermittent finger hold onto railing)     Pt standing endurance x 30 min today with 1 brief seated rest break      PT Education - 08/01/20 0802    Education Details exercise technique and pacing    Person(s) Educated Patient    Methods Explanation    Comprehension Verbalized understanding;Returned demonstration            PT Short Term Goals - 07/25/20 1131      PT SHORT TERM GOAL #1   Title Pt will demonstrate ability to independently perform a HEP for LE flexibility, LE ROM, LE strength    Baseline 3/22- progressing    Time 4    Period Weeks    Status New    Target Date 08/08/20             PT Long Term Goals - 07/25/20 1132      PT LONG TERM GOAL #1   Title Pt will improve FOTO score to 58  to indiecate improved functional mobility    Baseline 07/11/20: 42    Time 6    Period Weeks    Status New      PT LONG TERM GOAL #2   Title Pt will perform 5x STS in <15 sec indicating improved LE strength and decreased fall risk    Baseline 07/11/20: 20 sec, 3/22: 18 sec    Time 6    Period Weeks    Status New      PT LONG TERM GOAL #3   Title Improve R LE hip flexion, abd, extension to >4/5 all to improve his ability to perform transfers sit to stand with reduced LBP    Baseline 07/11/20: R hip flexion 3+/5    Time 6    Period Weeks    Status New      PT LONG TERM GOAL #4   Title Pt will be able to perform SLS x >5 seconds R and L LE without UE support to improve safety with ambulation and reduce rall risk    Baseline unable; 3/22: 4-5 sec with UE support    Time 6    Period Weeks                 Plan - 08/01/20 0848    Clinical Impression Statement Pt was challenged with posterior chain strengthening exercises today in standing.  He was able to tolerate cummulative standing of 30 min during  today's session; overall standing endurance is improving.  He had a few small episodes of loss of balance during session while amb without SPC requiring a step backwards to steady himself independently without PT support.  Overall he should cont to benefit from skilled PT to continue addressing LE/trunk strength and balance deficits.    Personal Factors and Comorbidities Age;Past/Current Experience;Comorbidity 1    Comorbidities HTN    Examination-Activity Limitations Bed Mobility;Bend;Dressing;Locomotion Level;Sit;Squat;Stairs;Stand;Transfers    Examination-Participation Restrictions Community Activity;Yard Work    Conservation officer, historic buildings Evolving/Moderate complexity    Rehab Potential Good    PT Frequency 2x / week    PT Duration 6 weeks    PT Treatment/Interventions ADLs/Self Care Home Management;Cryotherapy;DME Instruction;Moist Heat;Gait training;Stair training;Functional mobility training;Therapeutic activities;Therapeutic exercise;Balance training;Neuromuscular re-education;Manual techniques;Patient/family education;Passive range of motion    PT Next Visit Plan lumbar/pelvic/hip strengthening R>L, balance- especially with dual tasking while amb and uneven surfaces    PT Home Exercise Plan Access Code: [email or web address]    Consulted and Agree with Plan of Care Patient           Patient will benefit from skilled therapeutic intervention in order to improve the following deficits and impairments:  Abnormal gait,Decreased range of motion,Difficulty walking,Decreased endurance,Decreased activity tolerance,Pain,Decreased balance,Hypomobility,Impaired flexibility,Postural dysfunction,Impaired sensation,Decreased strength,Decreased mobility  Visit Diagnosis: Back pain without sciatica  Other abnormalities of gait and mobility  Muscle weakness (generalized)     Problem List There are no problems to display for this patient.   Ardine Bjork 08/01/2020, 8:51 AM Max Fickle,  PT, DPT    [facility] [address] [place], [place], [address] Phone: [phone]   Fax:  [phone]  Name: Lonnie Rodriguez MRN: [id number] Date of Birth: Aug 25, 1950

## 2020-08-03 ENCOUNTER — Other Ambulatory Visit: Payer: Self-pay

## 2020-08-03 ENCOUNTER — Ambulatory Visit: Payer: Medicare PPO

## 2020-08-03 DIAGNOSIS — M5489 Other dorsalgia: Secondary | ICD-10-CM | POA: Diagnosis not present

## 2020-08-03 DIAGNOSIS — M6281 Muscle weakness (generalized): Secondary | ICD-10-CM

## 2020-08-03 DIAGNOSIS — R2689 Other abnormalities of gait and mobility: Secondary | ICD-10-CM

## 2020-08-03 NOTE — Therapy (Signed)
[place] [facility] [address] [place], [place], [address] Phone: [phone]   Fax:  [phone]  Physical Therapy Treatment  Patient Details  Name: Lonnie Rodriguez MRN: [id number] Date of Birth: April 11, 1951 Referring Provider (PT): Lucy Chris, MD   Encounter Date: 08/03/2020   PT End of Session - 08/03/20 0802    Visit Number 8    Number of Visits 12    Date for PT Re-Evaluation 08/22/20    Authorization Type Medicare    Authorization Time Period Eval 07/11/20    Authorization - Visit Number 8    Authorization - Number of Visits 10    Progress Note Due on Visit 10    PT Start Time 0800    PT Stop Time 0845    PT Time Calculation (min) 45 min    Equipment Utilized During Treatment Gait belt    Activity Tolerance Patient tolerated treatment well    Behavior During Therapy [facility] for tasks assessed/performed           No past medical history on file.  No past surgical history on file.  There were no vitals filed for this visit.   Subjective Assessment - 08/03/20 0753    Subjective Pt reports he is only "a little sore" this morning.  He noted no increase in pain after last session's new exercises.    Pertinent History he had cervical fusion surgery in 2020    How long can you sit comfortably? 40 min    How long can you stand comfortably? 25-30 min    How long can you walk comfortably? 15 min    Diagnostic tests x-ray for lower back    Patient Stated Goals to address the pain in R lower back and to get his R leg feeling like his L leg    Pain Score 0-No pain          Treatment Today: Nustep: seat 11, arms 11, level 6 x 6 min, goal steps 80-85 SPM for endurance/LE strengthening (progressed resistance today)  Front step up RLLR x15 Lateral step down R LE x15  3 lb ankle weights -Standing Marches 3x10 ea LE -Standing Hamstring curls 3x10 ea LE -Standing hip extension 3x10 ea LE   TA brace with sit to stand 3x5 while holding  7 lb weight in hands   Neuro re-ed Standing alternating foot taps on cones while holding ball in hands- 2x15 ea Hurdles (small)- forward and lateral step overs 4 laps ea while holding ball in hands         PT Education - 08/03/20 0801    Education Details exercise technique and pacing    Person(s) Educated Patient    Methods Explanation;Demonstration    Comprehension Verbalized understanding;Returned demonstration            PT Short Term Goals - 07/25/20 1131      PT SHORT TERM GOAL #1   Title Pt will demonstrate ability to independently perform a HEP for LE flexibility, LE ROM, LE strength    Baseline 3/22- progressing    Time 4    Period Weeks    Status New    Target Date 08/08/20             PT Long Term Goals - 07/25/20 1132      PT LONG TERM GOAL #1   Title Pt will improve FOTO score to 58 to indiecate improved functional mobility    Baseline 07/11/20:  42    Time 6    Period Weeks    Status New      PT LONG TERM GOAL #2   Title Pt will perform 5x STS in <15 sec indicating improved LE strength and decreased fall risk    Baseline 07/11/20: 20 sec, 3/22: 18 sec    Time 6    Period Weeks    Status New      PT LONG TERM GOAL #3   Title Improve R LE hip flexion, abd, extension to >4/5 all to improve his ability to perform transfers sit to stand with reduced LBP    Baseline 07/11/20: R hip flexion 3+/5    Time 6    Period Weeks    Status New      PT LONG TERM GOAL #4   Title Pt will be able to perform SLS x >5 seconds R and L LE without UE support to improve safety with ambulation and reduce rall risk    Baseline unable; 3/22: 4-5 sec with UE support    Time 6    Period Weeks                 Plan - 08/03/20 0802    Clinical Impression Statement Pt tolerated therapeutic exercises well today with no complaint of increased LBP during session.  He is challenged with LE/trunk strengthening and balance exercises.  Decreased hip/gluteal mm strength  continue to affect gait mechanics contributing to decreased gait speed and decreased hip extension during push off phase of gait, especially when stepping over obstables.  He was challenged with progression of chair squat with addition of holding weight (front goblet squat).  Overall he should continue to benefit from skilled PT to address strength and balance impairments to continue progressing toward goals.  Plan to re-assess progress toward goals at next visit.    Personal Factors and Comorbidities Age;Past/Current Experience;Comorbidity 1    Comorbidities HTN    Examination-Activity Limitations Bed Mobility;Bend;Dressing;Locomotion Level;Sit;Squat;Stairs;Stand;Transfers    Examination-Participation Restrictions Community Activity;Yard Work    Conservation officer, historic buildings Evolving/Moderate complexity    Rehab Potential Good    PT Frequency 2x / week    PT Duration 6 weeks    PT Treatment/Interventions ADLs/Self Care Home Management;Cryotherapy;DME Instruction;Moist Heat;Gait training;Stair training;Functional mobility training;Therapeutic activities;Therapeutic exercise;Balance training;Neuromuscular re-education;Manual techniques;Patient/family education;Passive range of motion    PT Next Visit Plan lumbar/pelvic/hip strengthening R>L, balance- especially with dual tasking while amb and uneven surfaces    PT Home Exercise Plan Access Code: [email or web address]    Consulted and Agree with Plan of Care Patient           Patient will benefit from skilled therapeutic intervention in order to improve the following deficits and impairments:  Abnormal gait,Decreased range of motion,Difficulty walking,Decreased endurance,Decreased activity tolerance,Pain,Decreased balance,Hypomobility,Impaired flexibility,Postural dysfunction,Impaired sensation,Decreased strength,Decreased mobility  Visit Diagnosis: Back pain without sciatica  Other abnormalities of gait and mobility  Muscle weakness  (generalized)     Problem List There are no problems to display for this patient.   Ardine Bjork 08/03/2020, 10:53 AM Max Fickle, PT, DPT Physical Therapist - [place]  [facility] [address] [place], [place], [address] Phone: [phone]   Fax:  [phone]  Name: ARISTIDES LUCKEY MRN: [id number] Date of Birth: June 11, 1950

## 2020-08-15 ENCOUNTER — Other Ambulatory Visit: Payer: Self-pay

## 2020-08-15 ENCOUNTER — Ambulatory Visit: Payer: Medicare PPO | Attending: Neurosurgery

## 2020-08-15 DIAGNOSIS — M6281 Muscle weakness (generalized): Secondary | ICD-10-CM | POA: Insufficient documentation

## 2020-08-15 DIAGNOSIS — R2689 Other abnormalities of gait and mobility: Secondary | ICD-10-CM | POA: Diagnosis present

## 2020-08-15 DIAGNOSIS — M5489 Other dorsalgia: Secondary | ICD-10-CM | POA: Diagnosis present

## 2020-08-15 NOTE — Therapy (Signed)
[place] [facility] [address] [place], [place], [address] Phone: [phone]   Fax:  [phone]  Physical Therapy Treatment  Patient Details  Name: Lonnie Rodriguez MRN: [id number] Date of Birth: 21-Oct-1950 Referring Provider (PT): Lucy Chris, MD   Encounter Date: 08/15/2020   PT End of Session - 08/15/20 0808    Visit Number 9    Number of Visits 12    Date for PT Re-Evaluation 08/22/20    Authorization Type Medicare    Authorization Time Period Eval 07/11/20    Authorization - Visit Number 9    Authorization - Number of Visits 10    Progress Note Due on Visit 10    PT Start Time 0800    PT Stop Time 0845    PT Time Calculation (min) 45 min    Equipment Utilized During Treatment Gait belt    Activity Tolerance Patient tolerated treatment well    Behavior During Therapy [facility] for tasks assessed/performed           No past medical history on file.  No past surgical history on file.  There were no vitals filed for this visit.   Subjective Assessment - 08/15/20 0758    Subjective Pt reports experiencing soreness in the mornings, sometimes stiffness in the day in his lower back.  He feels like he needs to focus to get his feet moving how he wants them to sometimes.  He reports no falls; he used cane out in the yard this week.    Pertinent History he had cervical fusion surgery in 2020    How long can you sit comfortably? 40 min    How long can you stand comfortably? 30 min    How long can you walk comfortably? 30 min    Diagnostic tests x-ray for lower back    Patient Stated Goals to address the pain in R lower back and to get his R leg feeling like his L leg    Currently in Pain? No/denies           Treatment Today: Nustep: seat 11, arms 11, level 6 x 8 min, goal steps 80-85 SPM for endurance/LE strengthening/aerobic conditioning (progressed duration today)   Front step up RLLR x15, 2 sets (focused on R hip flexion for R  foot clearance) TA brace with sit to stand 3x5 while holding 7 lb weight in hands   3 lb ankle weights -Standing Marches 2x12 ea LE -Standing Hamstring curls 2x12 ea LE -Standing hip extension 2x12 ea LE     Neuro re-ed -Alt foot taps onto step while standing on airex: 2x20 (PT SBA with gait belt) -Hurdles (small)- forward and lateral step overs 4 laps ea while holding ball in hands -Ladder: slow marches forward, 2 laps (emphasized hip flexion strategy), lateral marches 2 laps, holding ball during exercise        PT Short Term Goals - 08/15/20 1048      PT SHORT TERM GOAL #1   Title Pt will demonstrate ability to independently perform a HEP for LE flexibility, LE ROM, LE strength    Baseline 3/22- progressing    Time 4    Period Weeks    Status New    Target Date 08/08/20             PT Long Term Goals - 08/15/20 1048      PT LONG TERM GOAL #1   Title Pt will improve FOTO score  to 58 to indiecate improved functional mobility    Baseline 07/11/20: 42; 4/12: 53    Time 6    Period Weeks    Status New      PT LONG TERM GOAL #2   Title Pt will perform 5x STS in <15 sec indicating improved LE strength and decreased fall risk    Baseline 07/11/20: 20 sec, 3/22: 18 sec    Time 6    Period Weeks    Status New      PT LONG TERM GOAL #3   Title Improve R LE hip flexion, abd, extension to >4/5 all to improve his ability to perform transfers sit to stand with reduced LBP    Baseline 07/11/20: R hip flexion 3+/5    Time 6    Period Weeks    Status New      PT LONG TERM GOAL #4   Title Pt will be able to perform SLS x >5 seconds R and L LE without UE support to improve safety with ambulation and reduce rall risk    Baseline unable; 3/22: 4-5 sec with UE support    Time 6    Period Weeks                 Plan - 08/15/20 0810    Clinical Impression Statement Pt demonstrating improved balance during standing balance exercises today.  He is making progress toward PT  goals as supported by improvement in FOTO score today.  Plan for re-assessment at next session.    Personal Factors and Comorbidities Age;Past/Current Experience;Comorbidity 1    Comorbidities HTN    Examination-Activity Limitations Bed Mobility;Bend;Dressing;Locomotion Level;Sit;Squat;Stairs;Stand;Transfers    Examination-Participation Restrictions Community Activity;Yard Work    Conservation officer, historic buildings Evolving/Moderate complexity    Rehab Potential Good    PT Frequency 2x / week    PT Duration 6 weeks    PT Treatment/Interventions ADLs/Self Care Home Management;Cryotherapy;DME Instruction;Moist Heat;Gait training;Stair training;Functional mobility training;Therapeutic activities;Therapeutic exercise;Balance training;Neuromuscular re-education;Manual techniques;Patient/family education;Passive range of motion    PT Next Visit Plan lumbar/pelvic/hip strengthening R>L, balance- especially with dual tasking while amb and uneven surfaces    PT Home Exercise Plan Access Code: [email or web address]    Consulted and Agree with Plan of Care Patient           Patient will benefit from skilled therapeutic intervention in order to improve the following deficits and impairments:  Abnormal gait,Decreased range of motion,Difficulty walking,Decreased endurance,Decreased activity tolerance,Pain,Decreased balance,Hypomobility,Impaired flexibility,Postural dysfunction,Impaired sensation,Decreased strength,Decreased mobility  Visit Diagnosis: Back pain without sciatica  Other abnormalities of gait and mobility  Muscle weakness (generalized)     Problem List There are no problems to display for this patient.   Ardine Bjork 08/15/2020, 10:49 AM Max Fickle, PT, DPT   [facility] [address] [place], [place], [address] Phone: [phone]   Fax:  [phone]  Name: Lonnie Rodriguez MRN: [id number] Date of Birth: 07/17/1950

## 2020-08-17 ENCOUNTER — Ambulatory Visit: Payer: Medicare PPO

## 2020-08-17 ENCOUNTER — Other Ambulatory Visit: Payer: Self-pay

## 2020-08-17 DIAGNOSIS — M6281 Muscle weakness (generalized): Secondary | ICD-10-CM

## 2020-08-17 DIAGNOSIS — M5489 Other dorsalgia: Secondary | ICD-10-CM

## 2020-08-17 DIAGNOSIS — R2689 Other abnormalities of gait and mobility: Secondary | ICD-10-CM

## 2020-08-17 NOTE — Therapy (Signed)
[place] [facility] [address] [place], [place], [address] Phone: [phone]   Fax:  [phone]  Physical Therapy Treatment Progress Note Physical Therapy Progress Note/Re-certification   Dates of reporting period  07/11/20   to   08/17/20  Patient Details  Name: Lonnie Rodriguez MRN: [id number] Date of Birth: [birth year] Referring Provider (PT): Deetta Perla, MD   Encounter Date: 08/17/2020   PT End of Session - 08/17/20 0807    Visit Number 10    Number of Visits 20    Date for PT Re-Evaluation 09/15/20    Authorization Type Medicare    Authorization Time Period Eval 12/10/75, PN and recert done 3/73/66    Authorization - Visit Number 10    Authorization - Number of Visits 20    Progress Note Due on Visit 20    PT Start Time 0800    PT Stop Time 0845    PT Time Calculation (min) 45 min    Equipment Utilized During Treatment Gait belt    Activity Tolerance Patient tolerated treatment well    Behavior During Therapy [facility] for tasks assessed/performed           No past medical history on file.  No past surgical history on file.  There were no vitals filed for this visit.   Subjective Assessment - 08/17/20 0804    Subjective Pt reports he is feeling some stiffness/soreness in his low back but no pain this morning.  He has been using the cane for balance especially when he feels tired/weaker at end of the day.    Pertinent History he had cervical fusion surgery in 2020    How long can you sit comfortably? 40 min    How long can you stand comfortably? 30 min    How long can you walk comfortably? 30 min    Diagnostic tests x-ray for lower back    Patient Stated Goals to address the pain in R lower back and to get his R leg feeling like his L leg    Currently in Pain? No/denies             Gait assessment: with SPC (PT supervision) -Flat surfaces in clinic: pt able to amb with symmetrical stride length -Uneven/inclined surfaces  (grass/mulch surface changes)- occasionally wide base of support, inconsistent foot placement (occasionally drag R foot), decreased speed/posterior chain strength observed with amb inclines  Curb navigation using SPC: instructed pt on technique and then practiced placement of SPC to assist R LE on step up and down from standard curb x 6, pt would benefit from more practice to improve safety if he is navigating curbs alone  5x STS: 16 sec today, 2 trials  Strength: MMT Right Left  Hip flexion 4-/5 (was 3+/5 at initial eval) 4+/5  Hip Abduction 3+/5 (was 3/5 at initial eval) 3+/5  Hip Adduction NT/5 NT/5  Knee Extension  4/5 4+/5  Knee Flexion 4/5 4+/5  DF 5/5 5/5     (-) seated slump on R LE today and L LE too -balance: pt able to stand SLS R and L x 6 sec, use of TA activation improved trunk posture and control during SLS     Ankle weights (3lb) Standing hip flexion: 2x12 Standing hip abd: 2x12 Standing hip extension: 2x12   Progress note/recert completed today    PT Education - 08/17/20 0807    Education Details exercise technique/pacing    Person(s) Educated Patient  Methods Explanation;Demonstration    Comprehension Verbalized understanding;Returned demonstration            PT Short Term Goals - 08/17/20 0840      PT SHORT TERM GOAL #1   Title Pt will demonstrate ability to independently perform a HEP for LE flexibility, LE ROM, LE strength    Baseline 3/22- progressing; achieved 08/17/20    Time 4    Period Weeks    Status Achieved    Target Date 08/08/20             PT Long Term Goals - 08/17/20 0841      PT LONG TERM GOAL #1   Title Pt will improve FOTO score to 58 to indiecate improved functional mobility    Baseline 07/11/20: 42; 4/12: 53    Time 4    Period Weeks    Status Partially Met    Target Date 09/15/20      PT LONG TERM GOAL #2   Title Pt will perform 5x STS in <15 sec indicating improved LE strength and decreased fall risk    Baseline  07/11/20: 20 sec, 3/22: 18 sec; 4/14: 16 sec    Time 4    Period Weeks    Status Partially Met    Target Date 09/15/20      PT LONG TERM GOAL #3   Title Improve R LE hip flexion, abd, extension to >4/5 all to improve his ability to perform transfers sit to stand with reduced LBP    Baseline 07/11/20: R hip flexion 3+/5; 4/14: R hip flexion 4-/5, abd 3+/5    Time 4    Period Weeks    Status Partially Met    Target Date 09/15/20      PT LONG TERM GOAL #4   Title Pt will be able to perform SLS x >5 seconds R and L LE without UE support to improve safety with ambulation and reduce rall risk    Baseline unable; 3/22: 4-5 sec with UE support; 4/14: 5-6 sec without UE support    Time 6    Period Weeks    Status Achieved                 Plan - 08/17/20 1137    Clinical Impression Statement Pt continues to make progress toward PT goals as strength and balance objective measures are improved.  He should continue to benefit from skilled PT to address remaining strength/balance impairments and to implement additional interventions designed for amb on even/uneven surfaces with his SPC as he still demonstrates inconsistent safe gait mechanics on uneven surfaces/curbs/surface changes today.    Personal Factors and Comorbidities Age;Past/Current Experience;Comorbidity 1    Comorbidities HTN    Examination-Activity Limitations Bed Mobility;Bend;Dressing;Locomotion Level;Sit;Squat;Stairs;Stand;Transfers    Examination-Participation Restrictions Community Activity;Yard Work    Merchant [employer] officer Evolving/Moderate complexity    Rehab Potential Good    PT Frequency 2x / week    PT Duration 6 weeks    PT Treatment/Interventions ADLs/Self Care Home Management;Cryotherapy;DME Instruction;Moist Heat;Gait training;Stair training;Functional mobility training;Therapeutic activities;Therapeutic exercise;Balance training;Neuromuscular re-education;Manual techniques;Patient/family  education;Passive range of motion    PT Next Visit Plan lumbar/pelvic/hip strengthening R>L, balance- especially with dual tasking while amb and uneven surfaces    PT Home Exercise Plan Access Code: [email or web address]    Consulted and Agree with Plan of Care Patient           Patient will benefit from skilled therapeutic intervention in order to improve the  following deficits and impairments:  Abnormal gait,Decreased range of motion,Difficulty walking,Decreased endurance,Decreased activity tolerance,Pain,Decreased balance,Hypomobility,Impaired flexibility,Postural dysfunction,Impaired sensation,Decreased strength,Decreased mobility  Visit Diagnosis: Back pain without sciatica  Other abnormalities of gait and mobility  Muscle weakness (generalized)     Problem List There are no problems to display for this patient.   Pincus Badder 08/17/2020, 11:55 AM Merdis Delay, PT, DPT    [place] [facility] [address] [place], [place], [address] Phone: [phone]   Fax:  [phone]  Name: Lonnie Rodriguez MRN: [id number] Date of Birth: July 20, 1950

## 2020-08-24 ENCOUNTER — Ambulatory Visit: Payer: Medicare PPO

## 2020-08-24 ENCOUNTER — Other Ambulatory Visit: Payer: Self-pay

## 2020-08-24 DIAGNOSIS — M5489 Other dorsalgia: Secondary | ICD-10-CM

## 2020-08-24 DIAGNOSIS — M6281 Muscle weakness (generalized): Secondary | ICD-10-CM

## 2020-08-24 DIAGNOSIS — R2689 Other abnormalities of gait and mobility: Secondary | ICD-10-CM

## 2020-08-24 NOTE — Therapy (Signed)
[facility] [address] [place], [place], [address] Phone: [phone]   Fax:  [phone]  Physical Therapy Treatment  Patient Details  Name: Lonnie Rodriguez MRN: [id number] Date of Birth: 1951-01-18 Referring Provider (PT): Deetta Perla, MD   Encounter Date: 08/24/2020   PT End of Session - 08/24/20 0809    Visit Number 11    Number of Visits 20    Date for PT Re-Evaluation 09/15/20    Authorization Type Medicare    Authorization Time Period Eval 1/0/93, PN and recert done 2/35/57    Authorization - Visit Number 11    Authorization - Number of Visits 20    Progress Note Due on Visit 20    PT Start Time 0800    PT Stop Time 0845    PT Time Calculation (min) 45 min    Equipment Utilized During Treatment Gait belt    Activity Tolerance Patient tolerated treatment well    Behavior During Therapy [facility] for tasks assessed/performed           No past medical history on file.  No past surgical history on file.  There were no vitals filed for this visit.   Subjective Assessment - 08/24/20 0758    Subjective Pt states he switched from Tylenol to Alleve at home for his back.  He reports when his balance is "wobbly" his back gets sore.  He reports no falls since last session.    Pertinent History he had cervical fusion surgery in 2020    How long can you sit comfortably? 40 min    How long can you stand comfortably? 30 min    How long can you walk comfortably? 30 min    Diagnostic tests x-ray for lower back    Patient Stated Goals to address the pain in R lower back and to get his R leg feeling like his L leg             Treatment Today: Nustep: seat 11, arms 11, level 6 x 8 min, goal steps 80-85 SPM for endurance/LE strengthening/aerobic conditioning (progressed duration today)   Lateral step up with R LE 2x10 (focused on R hip flexion for R foot clearance and WB on R LE); to promote improved safety with stepping up into  SUV TA brace with sit to stand 3x5 while holding 9 lb weight in hands (progressed weight)   4 lb ankle weights -Standing Marches 2x12 ea LE -Standing Hamstring curls 2x12 ea LE -Standing hip extension 2x12 ea LE  Curb training- front step up onto curb using SPC x 10, PT verbal and visual cues for technique leading with L LE, step down with R LE and SPC x 6    Neuro re-ed -Alt foot taps onto step while standing on airex: 2x20 (PT SBA with gait belt) -A/P upside down 1/2 foam roller balance in parallel bars- practiced removing hand support from bar and activating TA, 3 rounds x 2 min ea -amb on uneven surface (grass, incline/decline) with PT SBA, using SPC (5 min)- practiced forward amb, lateral stepping, reverse amb- 1 loss of balance while amb reverse direction         PT Education - 08/24/20 0808    Education Details exercise technique/pacing; discussed plan to do his walking routine (pt goal) with his wife to test it out before being DC'd.    Person(s) Educated Patient    Methods Explanation    Comprehension Verbalized understanding  PT Short Term Goals - 08/17/20 0840      PT SHORT TERM GOAL #1   Title Pt will demonstrate ability to independently perform a HEP for LE flexibility, LE ROM, LE strength    Baseline 3/22- progressing; achieved 08/17/20    Time 4    Period Weeks    Status Achieved    Target Date 08/08/20             PT Long Term Goals - 08/17/20 0841      PT LONG TERM GOAL #1   Title Pt will improve FOTO score to 58 to indiecate improved functional mobility    Baseline 07/11/20: 42; 4/12: 53    Time 4    Period Weeks    Status Partially Met    Target Date 09/15/20      PT LONG TERM GOAL #2   Title Pt will perform 5x STS in <15 sec indicating improved LE strength and decreased fall risk    Baseline 07/11/20: 20 sec, 3/22: 18 sec; 4/14: 16 sec    Time 4    Period Weeks    Status Partially Met    Target Date 09/15/20      PT LONG TERM  GOAL #3   Title Improve R LE hip flexion, abd, extension to >4/5 all to improve his ability to perform transfers sit to stand with reduced LBP    Baseline 07/11/20: R hip flexion 3+/5; 4/14: R hip flexion 4-/5, abd 3+/5    Time 4    Period Weeks    Status Partially Met    Target Date 09/15/20      PT LONG TERM GOAL #4   Title Pt will be able to perform SLS x >5 seconds R and L LE without UE support to improve safety with ambulation and reduce rall risk    Baseline unable; 3/22: 4-5 sec with UE support; 4/14: 5-6 sec without UE support    Time 6    Period Weeks    Status Achieved                 Plan - 08/24/20 0810    Clinical Impression Statement Pt had difficulty with amb lateral and reverse directions on uneven surfaces today- had 1 loss of balance, PT SBA.  He did better today with navigating curbs with his SPC than last session; still required verbal cues for safety.  Overall, he tolerated proression of standing LE strengthening therapeutic exercises well.  He should continue to benefit from skilled PT to facilitate improved safety and improved balance while amb on uneven surfaces.    Personal Factors and Comorbidities Age;Past/Current Experience;Comorbidity 1    Comorbidities HTN    Examination-Activity Limitations Bed Mobility;Bend;Dressing;Locomotion Level;Sit;Squat;Stairs;Stand;Transfers    Examination-Participation Restrictions Community Activity;Yard Work    [employer] officer Evolving/Moderate complexity    Rehab Potential Good    PT Frequency 2x / week    PT Duration 6 weeks    PT Treatment/Interventions ADLs/Self Care Home Management;Cryotherapy;DME Instruction;Moist Heat;Gait training;Stair training;Functional mobility training;Therapeutic activities;Therapeutic exercise;Balance training;Neuromuscular re-education;Manual techniques;Patient/family education;Passive range of motion    PT Next Visit Plan lumbar/pelvic/hip strengthening R>L, balance-  especially with dual tasking while amb and uneven surfaces    PT Home Exercise Plan Access Code: [email or web address]    Consulted and Agree with Plan of Care Patient           Patient will benefit from skilled therapeutic intervention in order to improve the following deficits and impairments:  Abnormal gait,Decreased range of motion,Difficulty walking,Decreased endurance,Decreased activity tolerance,Pain,Decreased balance,Hypomobility,Impaired flexibility,Postural dysfunction,Impaired sensation,Decreased strength,Decreased mobility  Visit Diagnosis: Back pain without sciatica  Other abnormalities of gait and mobility  Muscle weakness (generalized)     Problem List There are no problems to display for this patient.   Pincus Badder 08/24/2020, 11:09 AM Merdis Delay, PT, DPT Physical Therapist - [place]    [facility] [address] [place], [place], [address] Phone: [phone]   Fax:  [phone]  Name: Lonnie Rodriguez MRN: [id number] Date of Birth: Jun 17, 1950

## 2020-08-29 ENCOUNTER — Ambulatory Visit: Payer: Medicare PPO

## 2020-08-29 ENCOUNTER — Other Ambulatory Visit: Payer: Self-pay

## 2020-08-29 DIAGNOSIS — M5489 Other dorsalgia: Secondary | ICD-10-CM

## 2020-08-29 DIAGNOSIS — M6281 Muscle weakness (generalized): Secondary | ICD-10-CM

## 2020-08-29 DIAGNOSIS — R2689 Other abnormalities of gait and mobility: Secondary | ICD-10-CM

## 2020-08-29 NOTE — Therapy (Signed)
[place] [facility] [address] [place], [place], [address] Phone: [phone]   Fax:  [phone]  Physical Therapy Treatment  Patient Details  Name: Lonnie Rodriguez MRN: [id number] Date of Birth: 07-20-1950 Referring Provider (PT): Deetta Perla, MD   Encounter Date: 08/29/2020   PT End of Session - 08/29/20 0809    Visit Number 12    Number of Visits 20    Date for PT Re-Evaluation 09/15/20    Authorization Type Medicare    Authorization Time Period Eval 0/0/93, PN and recert done 12/22/27    Authorization - Visit Number 12    Authorization - Number of Visits 20    Progress Note Due on Visit 20    PT Start Time 0800    PT Stop Time 0845    PT Time Calculation (min) 45 min    Equipment Utilized During Treatment Gait belt    Activity Tolerance Patient tolerated treatment well    Behavior During Therapy [facility] for tasks assessed/performed           No past medical history on file.  No past surgical history on file.  There were no vitals filed for this visit.   Subjective Assessment - 08/29/20 0803    Subjective Pt reports getting into his SUV using the step went well this weekend; he tried doing a little more walking (went to outdoor outlet mall area with his wife- able to walk from parking lot to middle area of mall and then sat and waited for her); he had to use the [facility] for balance in the yard this weekend and at church to get to/from restroom.  No falls reported.  His lower back is feeling good upon arrival this morning.    Pertinent History he had cervical fusion surgery in 2020    How long can you sit comfortably? 40 min    How long can you stand comfortably? 30 min    How long can you walk comfortably? 30 min    Diagnostic tests x-ray for lower back    Patient Stated Goals to address the pain in R lower back and to get his R leg feeling like his L leg             Treatment Today: Nustep: seat 11, arms 11, level 6 x 10 min,  goal steps >90 SPMfor endurance/LE strengthening/aerobic conditioning(progressed duration today)  Lateral step up/eccentric step down with R LE 2x10 (focused on R hip flexion for R foot clearance and WB on R LE); to promote improved safety with stepping up into SUV TA brace with sit to stand 3x5 while holding9 lb weightin hands (progressed weight)  4 lb ankle weights -StandingMarches 2x12ea LE -StandingHamstring curls 2x12ea LE -Standing hip extension 2x12 ea LE  Step outside base of support forward, lateral, reverse with core mm bracing to practice "step catch" to regain balance in multiple directions with R/L LE- 3x5 ea Curb training- front step up onto curb using SPC x 10, PT verbal and visual cues for technique leading with L LE, step down with R LE and SPC x 6  Neuro re-ed -Alt foot taps onto step while standing on airex: 2x20 (PT SBA with gait belt) -A/P upside down 1/2 foam roller balance in parallel bars- practiced removing hand support from bar and activating TA, 3 rounds x 2 min ea -side steps on airex balance beam 2 laps in parallel bars (had multiple losses of balance- used bars  to regain balance) -amb on uneven surface (grass, incline/decline) with PT SBA, using SPC (5 min)- practiced forward amb, lateral stepping, reverse amb- no loss of balance today        PT Education - 08/29/20 0808    Education Details exercise technique/pacing, form            PT Short Term Goals - 08/17/20 0840      PT SHORT TERM GOAL #1   Title Pt will demonstrate ability to independently perform a HEP for LE flexibility, LE ROM, LE strength    Baseline 3/22- progressing; achieved 08/17/20    Time 4    Period Weeks    Status Achieved    Target Date 08/08/20             PT Long Term Goals - 08/17/20 0841      PT LONG TERM GOAL #1   Title Pt will improve FOTO score to 58 to indiecate improved functional mobility    Baseline 07/11/20: 42; 4/12: 53    Time 4    Period  Weeks    Status Partially Met    Target Date 09/15/20      PT LONG TERM GOAL #2   Title Pt will perform 5x STS in <15 sec indicating improved LE strength and decreased fall risk    Baseline 07/11/20: 20 sec, 3/22: 18 sec; 4/14: 16 sec    Time 4    Period Weeks    Status Partially Met    Target Date 09/15/20      PT LONG TERM GOAL #3   Title Improve R LE hip flexion, abd, extension to >4/5 all to improve his ability to perform transfers sit to stand with reduced LBP    Baseline 07/11/20: R hip flexion 3+/5; 4/14: R hip flexion 4-/5, abd 3+/5    Time 4    Period Weeks    Status Partially Met    Target Date 09/15/20      PT LONG TERM GOAL #4   Title Pt will be able to perform SLS x >5 seconds R and L LE without UE support to improve safety with ambulation and reduce rall risk    Baseline unable; 3/22: 4-5 sec with UE support; 4/14: 5-6 sec without UE support    Time 6    Period Weeks    Status Achieved                 Plan - 08/29/20 0811    Clinical Impression Statement Pt was most challenged with 1/2 foam roller balance activity today; unable to regain balance when loss of balance occurred without UE use on parallel bars.  He was more steady today than last session while amb on uneven grass/mulch and demonstrated good control with step up/down on curb today.  Plan to cont working on dynamic balance and strengthening activities to address remaining strenght/balance goals and facilitate improved safety during amb/reduced fall risk.    Personal Factors and Comorbidities Age;Past/Current Experience;Comorbidity 1    Comorbidities HTN    Examination-Activity Limitations Bed Mobility;Bend;Dressing;Locomotion Level;Sit;Squat;Stairs;Stand;Transfers    Examination-Participation Restrictions Community Activity;Yard Work    [employer] officer Evolving/Moderate complexity    Rehab Potential Good    PT Frequency 2x / week    PT Duration 6 weeks    PT Treatment/Interventions  ADLs/Self Care Home Management;Cryotherapy;DME Instruction;Moist Heat;Gait training;Stair training;Functional mobility training;Therapeutic activities;Therapeutic exercise;Balance training;Neuromuscular re-education;Manual techniques;Patient/family education;Passive range of motion    PT Next Visit Plan  lumbar/pelvic/hip strengthening R>L, balance- especially with dual tasking while amb and uneven surfaces    PT Home Exercise Plan Access Code: [email or web address]    Consulted and Agree with Plan of Care Patient           Patient will benefit from skilled therapeutic intervention in order to improve the following deficits and impairments:  Abnormal gait,Decreased range of motion,Difficulty walking,Decreased endurance,Decreased activity tolerance,Pain,Decreased balance,Hypomobility,Impaired flexibility,Postural dysfunction,Impaired sensation,Decreased strength,Decreased mobility  Visit Diagnosis: Back pain without sciatica  Other abnormalities of gait and mobility  Muscle weakness (generalized)     Problem List There are no problems to display for this patient.   Pincus Badder 08/29/2020, 10:30 AM Merdis Delay, PT, DPT    [facility] [address] [place], [place], [address] Phone: [phone]   Fax:  [phone]  Name: SAMIK BALKCOM MRN: [id number] Date of Birth: Jan 05, 1951

## 2020-08-31 ENCOUNTER — Other Ambulatory Visit: Payer: Self-pay

## 2020-08-31 ENCOUNTER — Ambulatory Visit: Payer: Medicare PPO

## 2020-08-31 DIAGNOSIS — R2689 Other abnormalities of gait and mobility: Secondary | ICD-10-CM

## 2020-08-31 DIAGNOSIS — M6281 Muscle weakness (generalized): Secondary | ICD-10-CM

## 2020-08-31 DIAGNOSIS — M5489 Other dorsalgia: Secondary | ICD-10-CM

## 2020-08-31 NOTE — Therapy (Signed)
[facility] [address] [place], [place], [address] Phone: [phone]   Fax:  [phone]  Physical Therapy Treatment/Discharge Note  Patient Details  Name: Lonnie Rodriguez MRN: [id number] Date of Birth: Aug 23, 1950 Referring Provider (PT): Deetta Perla, MD   Encounter Date: 08/31/2020   PT End of Session - 08/31/20 0809    Visit Number 13    Number of Visits 20    Date for PT Re-Evaluation 09/15/20    Authorization Type Medicare    Authorization Time Period Eval 10/08/38, PN and recert done 3/47/42    Authorization - Visit Number 13    Authorization - Number of Visits 20    Progress Note Due on Visit 20    PT Start Time 0800    PT Stop Time 0845    PT Time Calculation (min) 45 min    Equipment Utilized During Treatment Gait belt    Activity Tolerance Patient tolerated treatment well    Behavior During Therapy [facility] for tasks assessed/performed           No past medical history on file.  No past surgical history on file.  There were no vitals filed for this visit.   Subjective Assessment - 08/31/20 0804    Subjective Pt reports his lower back is feeling good this morning; his balance is feeling good this morning.  He plans to continue working on his HEP after today independently.  He is pleased with his progress in PT.    Pertinent History he had cervical fusion surgery in 2020    How long can you sit comfortably? 40 min    How long can you stand comfortably? 30 min    How long can you walk comfortably? 30 min    Diagnostic tests x-ray for lower back    Patient Stated Goals to address the pain in R lower back and to get his R leg feeling like his L leg- pt reports he has met his goals    Currently in Pain? No/denies           Treatment today:  Nustep: seat 11, arms 11, level 6 x 10 min, goal steps >90 SPM for endurance/LE strengthening/aerobic conditioning, achieved .50 miles   Lateral step up/eccentric step down  with R LE 2x10 (focused on R hip flexion for R foot clearance and WB on R LE); to promote improved safety with stepping up into SUV  TA brace with sit to stand 2x10 while holding 9 lb weight in hands   4 lb ankle weights -Standing Marches 2x12 ea LE -Standing Hamstring curls 2x12 ea LE -Standing hip extension 2x12 ea LE   Reviewed technique for his HEP and where in his house he will perform the exercises (4 min) Assessed PT and pt goals (4 min)- see goal section   Neuro re-ed -Alt foot taps onto step while standing on airex: 2x20 (PT supervision, no hands on assist) - reduced PT assist needed -alt lateral foot taps onto step while standing on airex: 2x20 (PT supervision, no hands on assist)- reduced PT assist needed -side steps on airex balance beam 2 laps in parallel bars (had multiple losses of balance- used bars to regain balance) -amb on uneven surface (grass, incline/decline) with PT SBA, using SPC (5 min)- practiced forward amb, lateral stepping, reverse amb- no loss of balance today          PT Education - 08/31/20 0808    Education Details exercise  technique/form    Person(s) Educated Patient    Methods Explanation    Comprehension Verbalized understanding;Returned demonstration            PT Short Term Goals - 08/31/20 0904      PT SHORT TERM GOAL #1   Title Pt will demonstrate ability to independently perform a HEP for LE flexibility, LE ROM, LE strength    Baseline 3/22- progressing; achieved 08/17/20    Time 4    Period Weeks    Status Achieved    Target Date 08/08/20             PT Long Term Goals - 08/31/20 0904      PT LONG TERM GOAL #1   Title Pt will improve FOTO score to 58 to indiecate improved functional mobility    Baseline 07/11/20: 42; 4/12: 53    Time 4    Period Weeks    Status Partially Met      PT LONG TERM GOAL #2   Title Pt will perform 5x STS in <15 sec indicating improved LE strength and decreased fall risk    Baseline 07/11/20: 20  sec, 3/22: 18 sec; 4/14: 16 sec; 4/28 15 sec    Time 4    Period Weeks    Status Achieved      PT LONG TERM GOAL #3   Title Improve R LE hip flexion, abd, extension to >4/5 all to improve his ability to perform transfers sit to stand with reduced LBP    Baseline 07/11/20: R hip flexion 3+/5; 4/14: R hip flexion 4-/5, abd 3+/5; 4/28: R hip abd 3+/5    Time 4    Period Weeks    Status Partially Met      PT LONG TERM GOAL #4   Title Pt will be able to perform SLS x >5 seconds R and L LE without UE support to improve safety with ambulation and reduce rall risk    Baseline unable; 3/22: 4-5 sec with UE support; 4/14: 5-6 sec without UE support    Time 6    Period Weeks    Status Achieved                 Plan - 08/31/20 0809    Clinical Impression Statement Pt has made good progress with PT.  His lower back pain, lower extremity/core strength, balance, and activity tolerance/walking and standing endurance have all improved with this course of tx.  He plans to transition to working on his HEP independently.  DC this course of tx.    Personal Factors and Comorbidities Age;Past/Current Experience;Comorbidity 1    Comorbidities HTN    Examination-Activity Limitations Bed Mobility;Bend;Dressing;Locomotion Level;Sit;Squat;Stairs;Stand;Transfers    Examination-Participation Restrictions Community Activity;Yard Work    [employer] officer Evolving/Moderate complexity    Rehab Potential Good    PT Frequency 2x / week    PT Duration 6 weeks    PT Treatment/Interventions ADLs/Self Care Home Management;Cryotherapy;DME Instruction;Moist Heat;Gait training;Stair training;Functional mobility training;Therapeutic activities;Therapeutic exercise;Balance training;Neuromuscular re-education;Manual techniques;Patient/family education;Passive range of motion    PT Next Visit Plan DC to HEP    PT Home Exercise Plan Access Code: [email or web address]    Consulted and Agree with Plan of Care Patient            Patient will benefit from skilled therapeutic intervention in order to improve the following deficits and impairments:  Abnormal gait,Decreased range of motion,Difficulty walking,Decreased endurance,Decreased activity tolerance,Pain,Decreased balance,Hypomobility,Impaired flexibility,Postural dysfunction,Impaired sensation,Decreased strength,Decreased mobility  Visit Diagnosis: Back pain without sciatica  Other abnormalities of gait and mobility  Muscle weakness (generalized)     Problem List There are no problems to display for this patient.   Pincus Badder 08/31/2020, 9:12 AM Merdis Delay, PT, DPT   [place] [facility] [address] [place], [place], [address] Phone: [phone]   Fax:  [phone]  Name: Lonnie Rodriguez MRN: [id number] Date of Birth: Jan 24, 1951

## 2020-09-04 ENCOUNTER — Ambulatory Visit: Payer: Medicare PPO

## 2021-02-26 IMAGING — MR MRI HEAD WITHOUT CONTRAST
20 of 22 series · 41 of 48 positions shown · non-contrast
Comparison: Head CT 09/29/2018

CLINICAL DATA: Numbness and balance disturbance.

EXAM:
MRI HEAD WITHOUT CONTRAST
TECHNIQUE: Multiplanar, multiecho pulse sequences of the brain and surrounding
structures were obtained without intravenous contrast.

[Series 5: ax dwi_tracew · axial · 3.0mm · 0.60mm/px · z∈[-47,+115]mm · 2 of 55 slices shown]
[im 1/55]
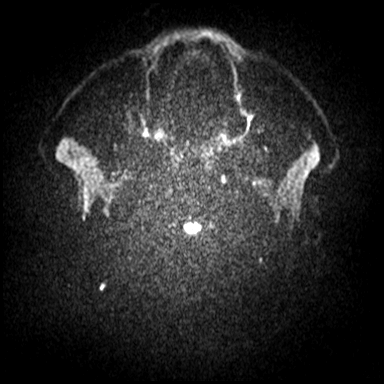
[im 55/55]
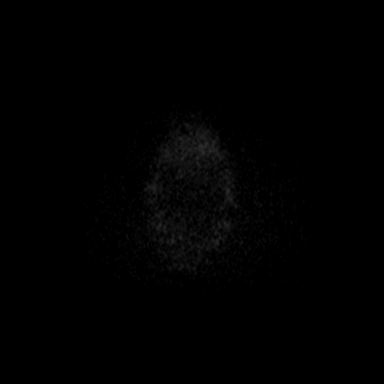

[Series 6: ax dwi_adc · axial · 3.0mm · 0.60mm/px · z∈[-47,+115]mm · 3 of 55 slices shown]
[im 1/55]
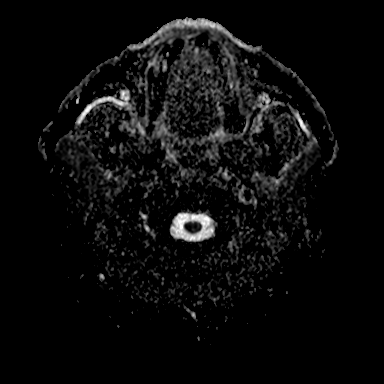
[im 28/55]
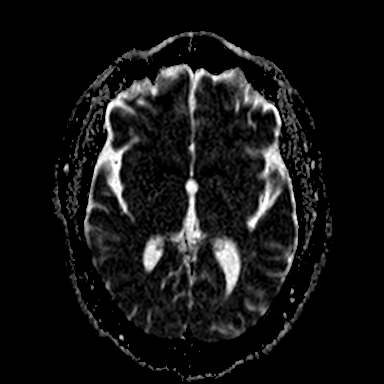
[im 55/55]
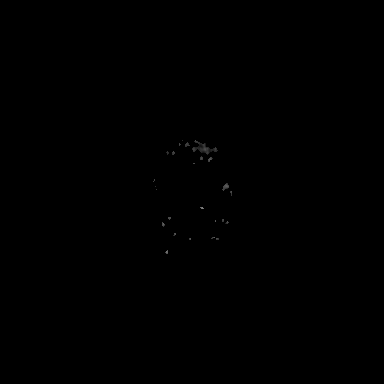

[Series 7: cor dwi_tracew · coronal · 5.0mm · 0.60mm/px · 2 of 41 slices shown]
[im 1/41]
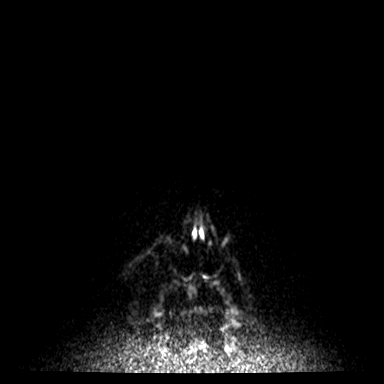
[im 41/41]
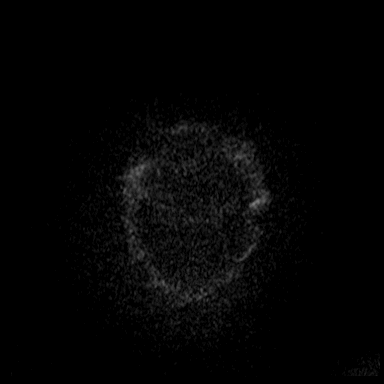

[Series 8: cor dwi_adc · coronal · 5.0mm · 0.60mm/px · 2 of 41 slices shown]
[im 1/41]
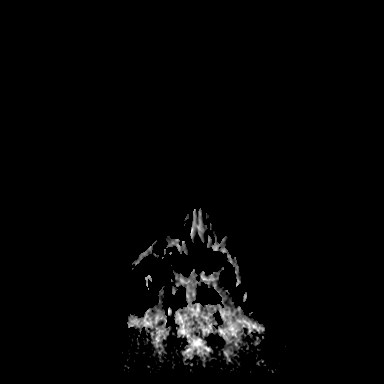
[im 41/41]
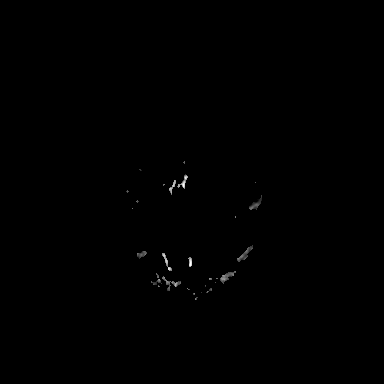

[Series 9: T1 · sagittal · 5.0mm · 0.62mm/px · 1 of 25 slices shown (1 of 4)]
[im 1/25]
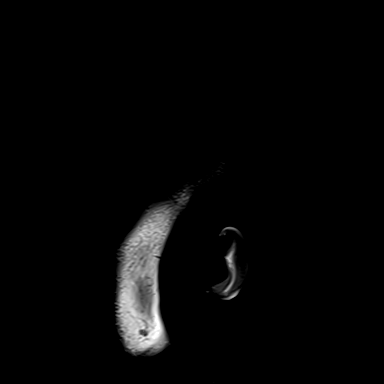

[Series 10: T2 · axial · 5.0mm · 0.53mm/px · 1 of 27 slices shown (1 of 6)]
[im 1/27]
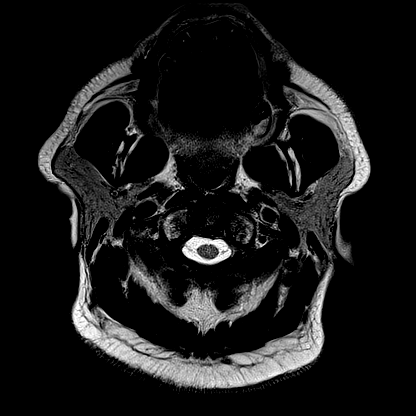

[Series 11: mag_images · axial · 3.0mm · 0.90mm/px · z∈[-53,+123]mm · 3 of 60 slices shown]
[im 1/60]
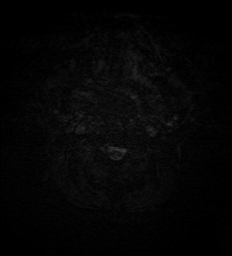
[im 30/60]
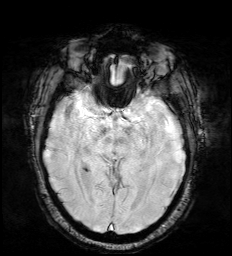
[im 60/60]
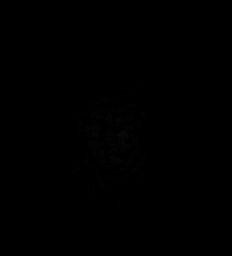

[Series 12: pha_images · axial · 3.0mm · 0.90mm/px · z∈[-50,+36]mm · 2 of 59 slices shown]
[im 1/59]
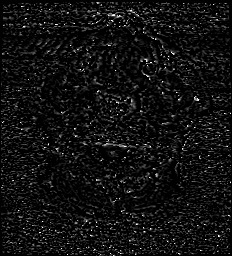
[im 30/59]
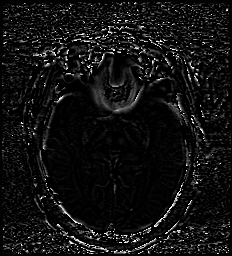

[Series 15: FLAIR · axial · 3.0mm · 0.53mm/px · z∈[-46,+115]mm · 3 of 55 slices shown (1 of 2)]
[im 1/55]
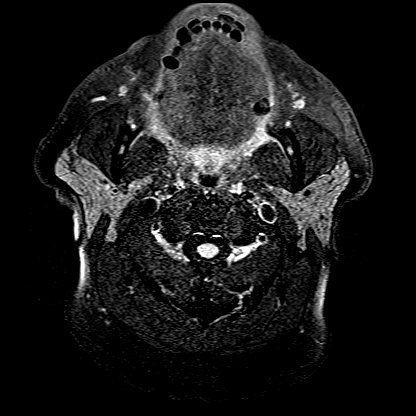
[im 28/55]
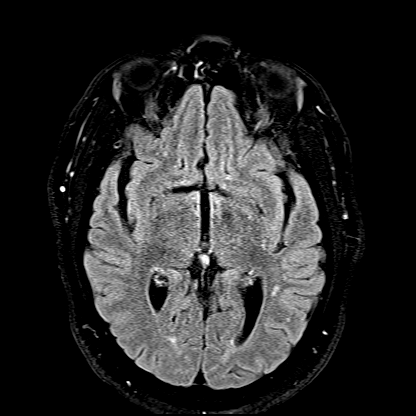
[im 55/55]
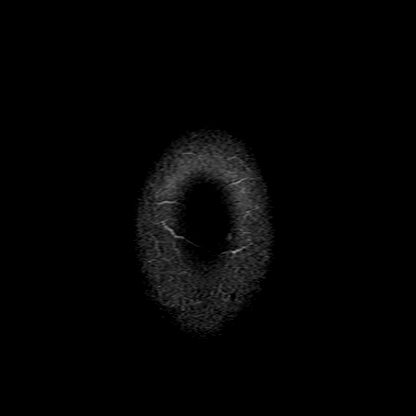

[Series 16: T1 · axial · 1.0mm · 0.98mm/px · z∈[-54,+120]mm · 8 of 176 slices shown (2 of 4)]
[im 1/176]
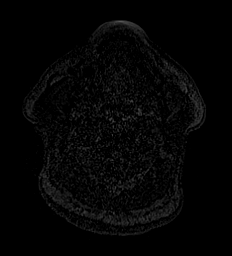
[im 22/176]
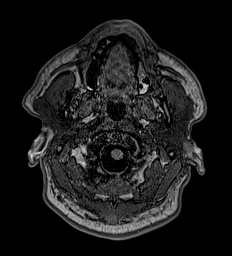
[im 44/176]
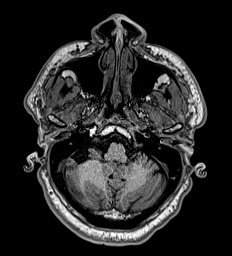
[im 66/176]
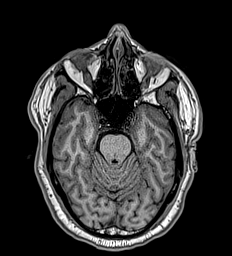
[im 110/176]
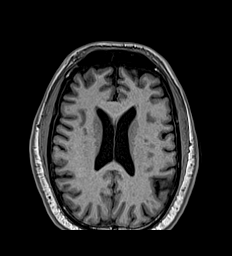
[im 132/176]
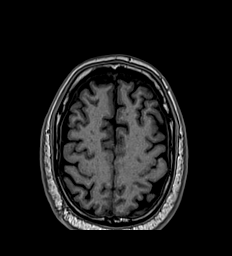
[im 154/176]
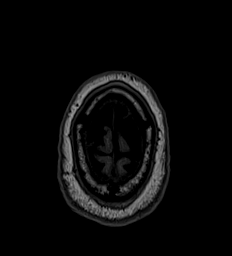
[im 176/176]
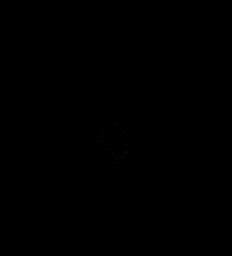

[Series 17: T2 · coronal · 5.0mm · 0.57mm/px · 2 of 29 slices shown (2 of 6)]
[im 1/29]
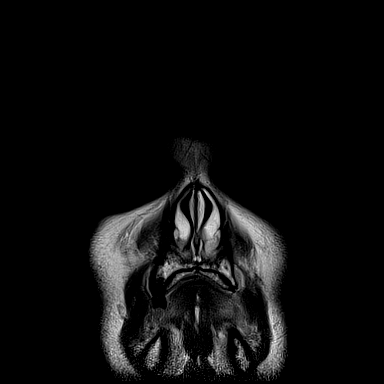
[im 29/29]
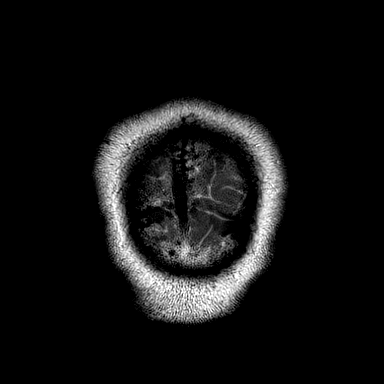

[Series 22: T2 · sagittal · 3.0mm · 0.62mm/px · 1 of 15 slices shown (3 of 6)]
[im 1/15]
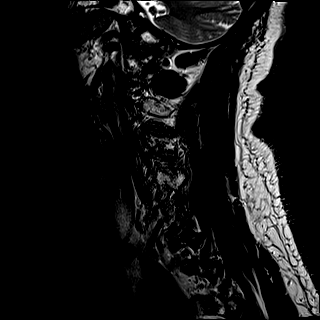

[Series 23: FLAIR · sagittal · 3.0mm · 0.78mm/px · 1 of 15 slices shown (2 of 2)]
[im 1/15]
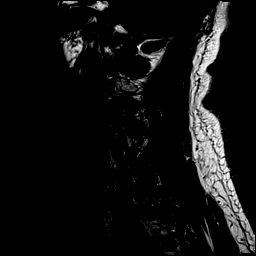

[Series 24: STIR · sagittal · 3.0mm · 0.62mm/px · 1 of 15 slices shown (1 of 2)]
[im 1/15]
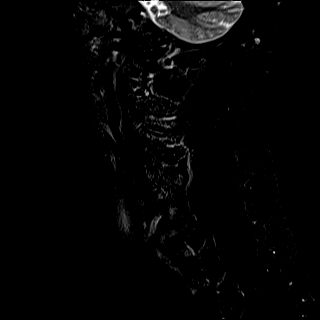

[Series 25: T2 · axial · 3.0mm · 0.70mm/px · z∈[-176,-81]mm · 2 of 29 slices shown (4 of 6)]
[im 1/29]
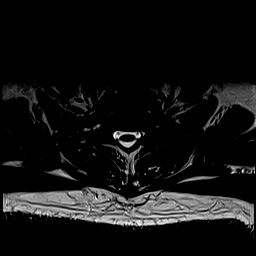
[im 29/29]
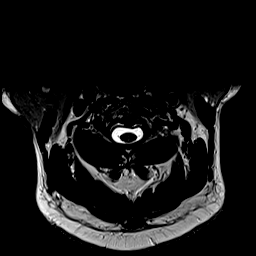

[Series 31: T2 · sagittal · 4.0mm · 0.81mm/px · 1 of 17 slices shown (5 of 6)]
[im 1/17]
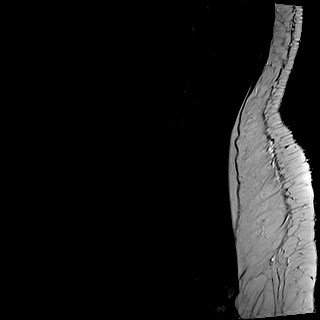

[Series 32: T1 · sagittal · 4.0mm · 0.81mm/px · 1 of 17 slices shown (3 of 4)]
[im 1/17]
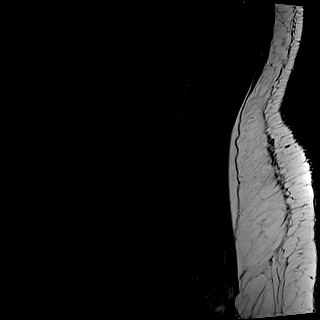

[Series 33: STIR · sagittal · 4.0mm · 0.41mm/px · 1 of 17 slices shown (2 of 2)]
[im 1/17]
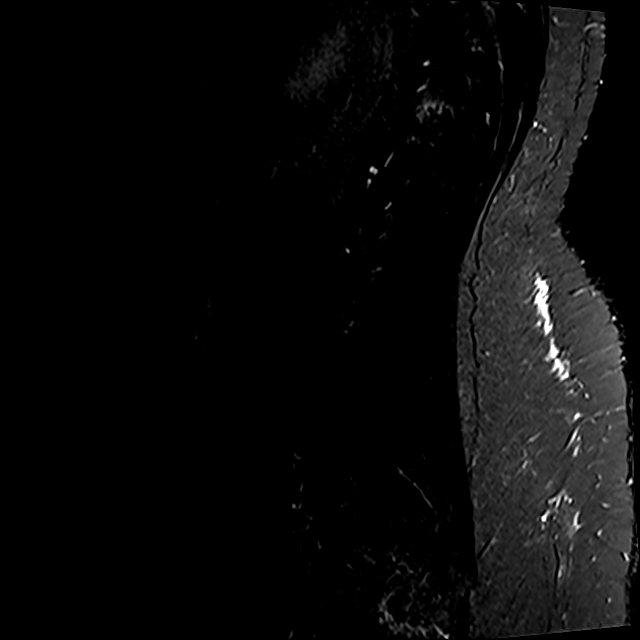

[Series 34: T2 · axial · 4.0mm · 0.78mm/px · z∈[-646,-417]mm · 2 of 36 slices shown (6 of 6)]
[im 1/36]
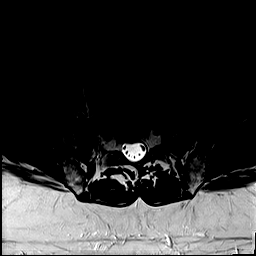
[im 36/36]
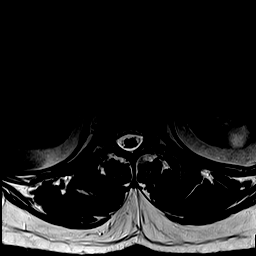

[Series 35: T1 · axial · 4.0mm · 0.39mm/px · z∈[-646,-417]mm · 2 of 36 slices shown (4 of 4)]
[im 1/36]
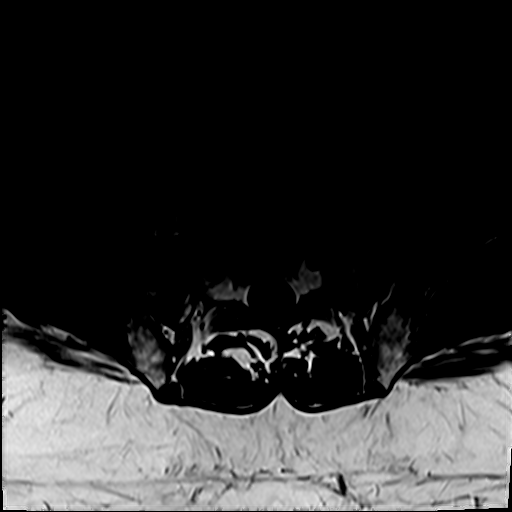
[im 36/36]
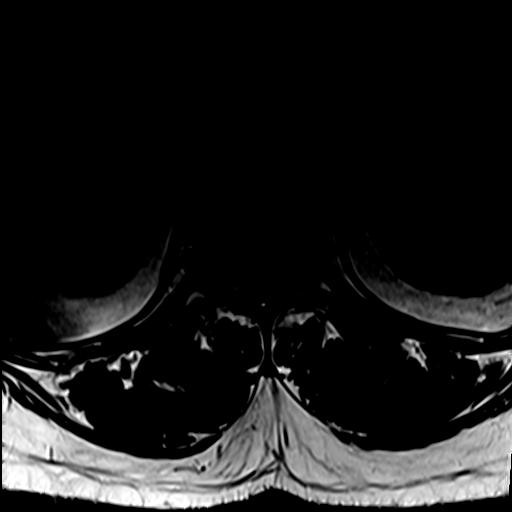

[41 of 48 positions shown; findings below may reference images not displayed]

FINDINGS: BRAIN: There is no acute infarct, acute hemorrhage or extra-axial
collection. The midline structures are normal. No midline shift or
other mass effect. Multifocal white matter hyperintensity, most
commonly due to chronic ischemic microangiopathy. The cerebral and
cerebellar volume are age-appropriate. No hydrocephalus.
Susceptibility-sensitive sequences show no chronic microhemorrhage
or superficial siderosis. No mass lesion.

VASCULAR: The major intracranial arterial and venous sinus flow
voids are normal.

SKULL: No calvarial lesion.

SINUSES/ORBITS: No fluid levels or advanced mucosal thickening. No
mastoid or middle ear effusion. The orbits are normal.
IMPRESSION: Chronic small vessel ischemia without acute intracranial
abnormality.

## 2021-05-19 ENCOUNTER — Other Ambulatory Visit: Payer: Self-pay

## 2021-05-19 ENCOUNTER — Ambulatory Visit
Admission: EM | Admit: 2021-05-19 | Discharge: 2021-05-19 | Disposition: A | Discharge status: Home / Self Care | Payer: Medicare PPO | Attending: Emergency Medicine | Admitting: Emergency Medicine

## 2021-05-19 DIAGNOSIS — U071 COVID-19: Secondary | ICD-10-CM | POA: Diagnosis present

## 2021-05-19 LAB — URINALYSIS, COMPLETE (UACMP) WITH MICROSCOPIC
Bacteria, UA: NONE SEEN
Bilirubin Urine: NEGATIVE
Glucose, UA: NEGATIVE mg/dL
Ketones, ur: 15 mg/dL — AB
Leukocytes,Ua: NEGATIVE
Nitrite: NEGATIVE
Protein, ur: NEGATIVE mg/dL
Specific Gravity, Urine: 1.025 (ref 1.005–1.030)
pH: 5.5 (ref 5.0–8.0)

## 2021-05-19 LAB — RESP PANEL BY RT-PCR (FLU A&B, COVID) ARPGX2
Influenza A by PCR: NEGATIVE
Influenza B by PCR: NEGATIVE
SARS Coronavirus 2 by RT PCR: POSITIVE — AB

## 2021-05-19 MED ORDER — IPRATROPIUM BROMIDE 0.06 % NA SOLN: 2.0000 | Freq: Four times a day (QID) | NASAL | 12 refills | Status: AC

## 2021-05-19 MED ORDER — BENZONATATE 100 MG PO CAPS: 200.0000 mg | ORAL_CAPSULE | Freq: Three times a day (TID) | ORAL | 0 refills | Status: AC

## 2021-05-19 MED ORDER — NIRMATRELVIR/RITONAVIR (PAXLOVID)TABLET: 3.0000 | ORAL_TABLET | Freq: Two times a day (BID) | ORAL | 0 refills | Status: AC

## 2021-05-19 MED ORDER — PROMETHAZINE-DM 6.25-15 MG/5ML PO SYRP: 5.0000 mL | ORAL_SOLUTION | Freq: Four times a day (QID) | ORAL | 0 refills | Status: AC | PRN

## 2021-05-19 NOTE — Discharge Instructions (Addendum)
You will have to quarantine for 5 days from the start of your symptoms.  After 5 days you can break quarantine if your symptoms have improved and you have not had a fever for 24 hours without taking Tylenol or ibuprofen.  Use over-the-counter Tylenol and ibuprofen as needed for body aches and fever.  Use the Tessalon Perles during the day as needed for cough and the Promethazine DM cough syrup at nighttime as will make you drowsy.  Use the Atrovent nasal spray, 2 squirts up each nostril every 6 hours, as needed for congestion.  Take the Paxlovid twice daily according to package instructions for treatment of COVID-19.  You will need to hold your atorvastatin while you take this medication.  You can resume 48 hours after you finish the Paxlovid.  If you develop any increased shortness of breath-especially at rest, you are unable to speak in full sentences, or is a late sign your lips are turning blue you need to go the ER for evaluation.

## 2021-05-19 NOTE — ED Provider Notes (Signed)
[facility]    CSN: [id number] Arrival date & time: 05/19/21  0802      History   Chief Complaint Chief Complaint  Patient presents with   Cough   Urinary Frequency    HPI Lonnie Rodriguez is a 71 y.o. male.   HPI  71 year old male here for evaluation of respiratory and genitourinary complaints.  Patient reports that his symptoms began 2 days ago and consist of nasal congestion, sore throat, nonproductive cough, mild shortness of breath and wheezing, body aches, urinary urgency and frequency, and increase in nocturia.  He reports that last night he was up to use the bathroom approximately 45 minutes.  He has not had any associated fever, nasal discharge, ear pain, nausea, vomiting, diarrhea.  He also denies any increased thirst or increase in his oral fluid intake.  Patient does have a history of high cholesterol, high blood pressure, and BPH with associated nocturia.  History reviewed. No pertinent past medical history.  There are no problems to display for this patient.   History reviewed. No pertinent surgical history.     Home Medications    Prior to Admission medications   Medication Sig Start Date End Date Taking? Authorizing Provider  aspirin EC 81 MG tablet Take 81 mg by mouth daily.   Yes [provider]  benzonatate (TESSALON) 100 MG capsule Take 2 capsules (200 mg total) by mouth every 8 (eight) hours. 05/19/21  Yes Becky Augusta, NP  ipratropium (ATROVENT) 0.06 % nasal spray Place 2 sprays into both nostrils 4 (four) times daily. 05/19/21  Yes Becky Augusta, NP  losartan (COZAAR) 50 MG tablet Take 1 tablet by mouth daily. 06/07/20 06/07/21 Yes [provider]  naproxen sodium (ALEVE) 220 MG tablet Take 220-440 mg by mouth 2 (two) times daily as needed (back pain and/or stiffness).   Yes [provider]  nirmatrelvir/ritonavir EUA (PAXLOVID) 20 x 150 MG & 10 x 100MG  TABS Take 3 tablets by mouth 2 (two) times daily for 5 days.  Patient GFR is 60. Take nirmatrelvir (150 mg) two tablets twice daily for 5 days and ritonavir (100 mg) one tablet twice daily for 5 days. 05/19/21 05/24/21 Yes 05/26/21, NP  promethazine-dextromethorphan (PROMETHAZINE-DM) 6.25-15 MG/5ML syrup Take 5 mLs by mouth 4 (four) times daily as needed. 05/19/21  Yes 05/21/21, NP  rosuvastatin (CRESTOR) 20 MG tablet Take by mouth. 06/07/20 06/07/21 Yes [provider]  tamsulosin (FLOMAX) 0.4 MG CAPS capsule Take by mouth. 06/07/20 06/07/21 Yes [provider]    Family History History reviewed. No pertinent family history.  Social History Social History   Tobacco Use   Smoking status: Never   Smokeless tobacco: Never  Vaping Use   Vaping Use: Never used  Substance Use Topics   Alcohol use: Never   Drug use: Never     Allergies   Patient has no known allergies.   Review of Systems Review of Systems  Constitutional:  Negative for activity change, appetite change and fever.  HENT:  Positive for congestion and sore throat. Negative for ear pain and rhinorrhea.   Respiratory:  Positive for cough, shortness of breath and wheezing.   Gastrointestinal:  Negative for diarrhea, nausea and vomiting.  Endocrine: Positive for polyuria. Negative for polydipsia.  Musculoskeletal:  Positive for arthralgias and myalgias.  Skin:  Negative for rash.  Neurological:  Negative for headaches.  Hematological: Negative.   Psychiatric/Behavioral: Negative.      Physical Exam Triage Vital Signs  ED Triage Vitals [05/19/21 0810]  Enc Vitals Group     BP      Pulse      Resp      Temp      Temp src      SpO2      Weight 255 lb (115.7 kg)     Height 5\' 9"  (1.753 m)     Head Circumference      Peak Flow      Pain Score 6     Pain Loc      Pain Edu?      Excl. in GC?    No data found.  Updated Vital Signs BP 124/78 (BP Location: Left Arm)    Pulse 87    Temp 99.6 F (37.6 C) (Oral)    Resp 20    Ht 5\' 9"  (1.753 m)    Wt 255 lb  (115.7 kg)    SpO2 97%    BMI 37.66 kg/m   Visual Acuity Right Eye Distance:   Left Eye Distance:   Bilateral Distance:    Right Eye Near:   Left Eye Near:    Bilateral Near:     Physical Exam Vitals and nursing note reviewed.  Constitutional:      General: He is not in acute distress.    Appearance: Normal appearance. He is not ill-appearing.  HENT:     Head: Normocephalic and atraumatic.     Right Ear: Tympanic membrane and external ear normal. There is no impacted cerumen.     Left Ear: Tympanic membrane and external ear normal. There is no impacted cerumen.     Nose: Congestion and rhinorrhea present.     Mouth/Throat:     Mouth: Mucous membranes are moist.     Pharynx: Oropharynx is clear. Posterior oropharyngeal erythema present.  Cardiovascular:     Rate and Rhythm: Normal rate and regular rhythm.     Pulses: Normal pulses.     Heart sounds: Normal heart sounds. No murmur heard.   No friction rub. No gallop.  Pulmonary:     Effort: Pulmonary effort is normal.     Breath sounds: Normal breath sounds. No wheezing, rhonchi or rales.  Musculoskeletal:     Cervical back: Normal range of motion and neck supple.  Lymphadenopathy:     Cervical: No cervical adenopathy.  Skin:    General: Skin is warm and dry.     Capillary Refill: Capillary refill takes less than 2 seconds.     Findings: No erythema or rash.  Neurological:     General: No focal deficit present.     Mental Status: He is alert and oriented to person, place, and time.  Psychiatric:        Mood and Affect: Mood normal.        Behavior: Behavior normal.        Thought Content: Thought content normal.        Judgment: Judgment normal.     UC Treatments / Results  Labs (all labs ordered are listed, but only abnormal results are displayed) Labs Reviewed  RESP PANEL BY RT-PCR (FLU A&B, COVID) ARPGX2 - Abnormal; Notable for the following components:      Result Value   SARS Coronavirus 2 by RT PCR  POSITIVE (*)    All other components within normal limits  URINALYSIS, COMPLETE (UACMP) WITH MICROSCOPIC - Abnormal; Notable for the following components:   Hgb urine dipstick TRACE (*)  Ketones, ur 15 (*)    All other components within normal limits    EKG   Radiology No results found.  Procedures Procedures (including critical care time)  Medications Ordered in UC Medications - No data to display  Initial Impression / Assessment and Plan / UC Course  I have reviewed the triage vital signs and the nursing notes.  Pertinent labs & imaging results that were available during my care of the patient were reviewed by me and considered in my medical decision making (see chart for details).  Patient is a very pleasant, nontoxic-appearing 71 year old male who has a past medical history significant for high blood pressure, high cholesterol, and BPH with associated nocturia.  He is here for evaluation of respiratory complaints as well as an increase in nocturia that started last night.  He reports that he voided about every 45 minutes and has had associated urgency and frequency of urination.  He denies any pain with urination.  Patient also denies fever, nasal discharge, ear pain, or GI complaints.  Also no polydipsia.  He does endorse nasal congestion, sore throat, nonproductive cough, shortness of breath, wheezing.  On physical exam patient's external auditory canals are moderately ceruminous bilaterally.  I am able to visualize the upper portion of both tympanic membranes which are pearly gray in appearance.  His nasal mucosa is erythematous and edematous with clear discharge in both nares.  Oropharyngeal exam reveals mild posterior oropharyngeal erythema with clear postnasal drip.  No injection, tonsillar edema, or exudate noted.  No cervical lymphadenopathy appreciated exam.  Cardiopulmonary exam reveals clear lung sounds in all fields.  Will check respiratory triplex panel for presence of  COVID or influenza and also collect urinalysis to look for possibility of urinary tract infection.  Respiratory triplex panel is positive for COVID.  Patient having history of hypertension, BPH, and high cholesterol he is eligible for antiviral therapy.  This coupled with his age and his BMI greater than 30 also places him at increased risk.  Per epic he had a CMP drawn 06/07/2020 which showed a GFR of 60.  We will place patient on Paxlovid twice daily for 5 days for treatment of COVID-19.  We will also give Tessalon Perles, Atrovent nasal spray, and Promethazine DM cough syrup as needed.  Urinalysis shows trace hemoglobin and 15 ketones.  No leukocyte esterase, nitrates, protein, white blood cells, red blood cells, bacteria, or squamous epithelials seen on microscopy.  Suspect patient's increased nocturia may be a result of COVID infection.   Final Clinical Impressions(s) / UC Diagnoses   Final diagnoses:  COVID-19     Discharge Instructions      You will have to quarantine for 5 days from the start of your symptoms.  After 5 days you can break quarantine if your symptoms have improved and you have not had a fever for 24 hours without taking Tylenol or ibuprofen.  Use over-the-counter Tylenol and ibuprofen as needed for body aches and fever.  Use the Tessalon Perles during the day as needed for cough and the Promethazine DM cough syrup at nighttime as will make you drowsy.  Use the Atrovent nasal spray, 2 squirts up each nostril every 6 hours, as needed for congestion.  Take the Paxlovid twice daily according to package instructions for treatment of COVID-19.  You will need to hold your atorvastatin while you take this medication.  You can resume 48 hours after you finish the Paxlovid.  If you develop any increased shortness of  breath-especially at rest, you are unable to speak in full sentences, or is a late sign your lips are turning blue you need to go the ER for evaluation.       ED Prescriptions     Medication Sig Dispense Auth. Provider   benzonatate (TESSALON) 100 MG capsule Take 2 capsules (200 mg total) by mouth every 8 (eight) hours. 21 capsule Becky Augusta, NP   ipratropium (ATROVENT) 0.06 % nasal spray Place 2 sprays into both nostrils 4 (four) times daily. 15 mL Becky Augusta, NP   promethazine-dextromethorphan (PROMETHAZINE-DM) 6.25-15 MG/5ML syrup Take 5 mLs by mouth 4 (four) times daily as needed. 118 mL Becky Augusta, NP   nirmatrelvir/ritonavir EUA (PAXLOVID) 20 x 150 MG & 10 x 100MG  TABS Take 3 tablets by mouth 2 (two) times daily for 5 days. Patient GFR is 60. Take nirmatrelvir (150 mg) two tablets twice daily for 5 days and ritonavir (100 mg) one tablet twice daily for 5 days. 30 tablet , NP      PDMP not reviewed this encounter.   Becky Augusta, NP 05/19/21 [phone]

## 2021-05-19 NOTE — ED Triage Notes (Signed)
Patient here for "Cough" & "Congestion". Up all night "peeing a lot". No dysuria. No abnormal flow/color/odor. "Body aches" all night too. No fever known. No fever. No recent COVID19 testing. No other recent testing. COVID19 vaccines done. Flu vaccine done.

## 2022-08-10 LAB — AMB RESULTS CONSOLE CBG: Glucose: 131

## 2022-08-10 NOTE — Progress Notes (Signed)
Pt not fasting 

## 2024-06-02 ENCOUNTER — Ambulatory Visit
Admission: RE | Admit: 2024-06-02 | Discharge: 2024-06-02 | Disposition: A | Discharge status: Home / Self Care | Source: Ambulatory Visit | Attending: Family Medicine | Admitting: Family Medicine

## 2024-06-02 ENCOUNTER — Other Ambulatory Visit: Payer: Self-pay | Admitting: Family Medicine

## 2024-06-02 DIAGNOSIS — R10A1 Flank pain, right side: Secondary | ICD-10-CM | POA: Diagnosis present

## 2024-06-02 DIAGNOSIS — R1030 Lower abdominal pain, unspecified: Secondary | ICD-10-CM | POA: Diagnosis present
